# Patient Record
Sex: Female | Born: 1990 | Race: Black or African American | Hispanic: No | Marital: Single | Smoking: Former smoker
Health system: Southern US, Community
[De-identification: ages and names within clinical notes are randomized; demographics above are authoritative.]

## PROBLEM LIST (undated history)

## (undated) DIAGNOSIS — D649 Anemia, unspecified: Secondary | ICD-10-CM

## (undated) DIAGNOSIS — Z789 Other specified health status: Secondary | ICD-10-CM

## (undated) DIAGNOSIS — F32A Depression, unspecified: Secondary | ICD-10-CM

## (undated) DIAGNOSIS — Z3490 Encounter for supervision of normal pregnancy, unspecified, unspecified trimester: Secondary | ICD-10-CM

## (undated) HISTORY — PX: NO PAST SURGERIES: SHX2092

---

## 2015-04-24 ENCOUNTER — Encounter (HOSPITAL_COMMUNITY): Payer: Self-pay | Admitting: Emergency Medicine

## 2015-04-24 ENCOUNTER — Emergency Department (HOSPITAL_COMMUNITY)
Admission: EM | Admit: 2015-04-24 | Discharge: 2015-04-25 | Disposition: A | Discharge status: Other Acute Inpatient Hospital | Payer: Self-pay | Attending: Emergency Medicine | Admitting: Emergency Medicine

## 2015-04-24 DIAGNOSIS — Y9289 Other specified places as the place of occurrence of the external cause: Secondary | ICD-10-CM | POA: Insufficient documentation

## 2015-04-24 DIAGNOSIS — Z3202 Encounter for pregnancy test, result negative: Secondary | ICD-10-CM | POA: Insufficient documentation

## 2015-04-24 DIAGNOSIS — Z7982 Long term (current) use of aspirin: Secondary | ICD-10-CM | POA: Insufficient documentation

## 2015-04-24 DIAGNOSIS — T1491XA Suicide attempt, initial encounter: Secondary | ICD-10-CM

## 2015-04-24 DIAGNOSIS — F1210 Cannabis abuse, uncomplicated: Secondary | ICD-10-CM | POA: Insufficient documentation

## 2015-04-24 DIAGNOSIS — Y9389 Activity, other specified: Secondary | ICD-10-CM | POA: Insufficient documentation

## 2015-04-24 DIAGNOSIS — Y998 Other external cause status: Secondary | ICD-10-CM | POA: Insufficient documentation

## 2015-04-24 DIAGNOSIS — T39012A Poisoning by aspirin, intentional self-harm, initial encounter: Secondary | ICD-10-CM | POA: Insufficient documentation

## 2015-04-24 DIAGNOSIS — F1410 Cocaine abuse, uncomplicated: Secondary | ICD-10-CM | POA: Insufficient documentation

## 2015-04-24 LAB — POC URINE PREG, ED: Preg Test, Ur: NEGATIVE

## 2015-04-24 LAB — COMPREHENSIVE METABOLIC PANEL
ALT: 10 U/L — ABNORMAL LOW (ref 14–54)
ANION GAP: 7 (ref 5–15)
AST: 15 U/L (ref 15–41)
Albumin: 3.8 g/dL (ref 3.5–5.0)
Alkaline Phosphatase: 57 U/L (ref 38–126)
BUN: 9 mg/dL (ref 6–20)
CHLORIDE: 109 mmol/L (ref 101–111)
CO2: 24 mmol/L (ref 22–32)
Calcium: 8.6 mg/dL — ABNORMAL LOW (ref 8.9–10.3)
Creatinine, Ser: 1.06 mg/dL — ABNORMAL HIGH (ref 0.44–1.00)
Glucose, Bld: 100 mg/dL — ABNORMAL HIGH (ref 65–99)
POTASSIUM: 3.3 mmol/L — AB (ref 3.5–5.1)
SODIUM: 140 mmol/L (ref 135–145)
Total Bilirubin: 0.3 mg/dL (ref 0.3–1.2)
Total Protein: 7.2 g/dL (ref 6.5–8.1)

## 2015-04-24 LAB — CBC WITH DIFFERENTIAL/PLATELET
Basophils Absolute: 0 10*3/uL (ref 0.0–0.1)
Basophils Relative: 1 % (ref 0–1)
EOS ABS: 0 10*3/uL (ref 0.0–0.7)
EOS PCT: 1 % (ref 0–5)
HCT: 30.4 % — ABNORMAL LOW (ref 36.0–46.0)
Hemoglobin: 9.4 g/dL — ABNORMAL LOW (ref 12.0–15.0)
LYMPHS ABS: 2.2 10*3/uL (ref 0.7–4.0)
Lymphocytes Relative: 51 % — ABNORMAL HIGH (ref 12–46)
MCH: 25.6 pg — AB (ref 26.0–34.0)
MCHC: 30.9 g/dL (ref 30.0–36.0)
MCV: 82.8 fL (ref 78.0–100.0)
MONO ABS: 0.3 10*3/uL (ref 0.1–1.0)
MONOS PCT: 8 % (ref 3–12)
Neutro Abs: 1.6 10*3/uL — ABNORMAL LOW (ref 1.7–7.7)
Neutrophils Relative %: 39 % — ABNORMAL LOW (ref 43–77)
PLATELETS: 277 10*3/uL (ref 150–400)
RBC: 3.67 MIL/uL — AB (ref 3.87–5.11)
RDW: 17.1 % — AB (ref 11.5–15.5)
WBC: 4.2 10*3/uL (ref 4.0–10.5)

## 2015-04-24 LAB — MAGNESIUM: MAGNESIUM: 1.9 mg/dL (ref 1.7–2.4)

## 2015-04-24 LAB — RAPID URINE DRUG SCREEN, HOSP PERFORMED
Amphetamines: NOT DETECTED
BARBITURATES: NOT DETECTED
BENZODIAZEPINES: NOT DETECTED
COCAINE: POSITIVE — AB
OPIATES: NOT DETECTED
TETRAHYDROCANNABINOL: POSITIVE — AB

## 2015-04-24 LAB — ACETAMINOPHEN LEVEL

## 2015-04-24 LAB — PROTIME-INR
INR: 0.99 (ref 0.00–1.49)
PROTHROMBIN TIME: 13.3 s (ref 11.6–15.2)

## 2015-04-24 LAB — SALICYLATE LEVEL: Salicylate Lvl: 26.3 mg/dL (ref 2.8–30.0)

## 2015-04-24 LAB — ETHANOL: Alcohol, Ethyl (B): <5 mg/dL (ref <5)

## 2015-04-24 NOTE — ED Notes (Signed)
Unable to collect labs at this time patient talking with TTS. 

## 2015-04-24 NOTE — BH Assessment (Addendum)
Tele Assessment Note   Peggy Hooper is an 24 y.o. female.  -Clinician discussed patient with Dr. Clayborne Dana.  He said that patient reports having taken about 21 aspirin last night in an attempt to overdose.  Pt reports nausea and ringing in her ears.  Patient is calm and pleasant during assessment.  When asked what she intended to do when she took the aspirin last night she replies, "I wanted to kill myself."  Pt currently denies wanting to kill self and says she can contract for safety.  Patient has no previous suicide attempts.  She denies any HI or A/V hallucinations.  Patient says that she lives with her boyfriend.  He is what is causing stress in her life.  She says that school [employer]) is stressful.  Patient denies depressive symptoms but does rate herself at a 4/10 on anxiety.  Also reports only getting about 4 hours of sleep per night because of "thinking about things over and over."    Patient has no previous inpatient or outpatient history.  -Clinician did discuss patient care with Donell Sievert, PA who recommends inpatient care.  Dr. Jama Flavors will be the attending.  Patient accepted to [facility].  Dr. Clayborne Dana informed of the patient being accepted to [facility] by Donell Sievert, PA to Dr. Jama Flavors.  Axis I: Major Depression, single episode and Substance Abuse Axis II: Deferred Axis III: History reviewed. No pertinent past medical history. Axis IV: economic problems, other psychosocial or environmental problems and problems with primary support group Axis V: 41-50 serious symptoms  Past Medical History: History reviewed. No pertinent past medical history.  History reviewed. No pertinent past surgical history.  Family History: History reviewed. No pertinent family history.  Social History:  reports that she has never smoked. She does not have any smokeless tobacco history on file. She reports that she uses illicit drugs (Marijuana). She reports that she does not drink alcohol.  Additional Social  History:  Alcohol / Drug Use Pain Medications: None Prescriptions: N/A Over the Counter: N/A History of alcohol / drug use?: Yes Substance #1 Name of Substance 1: Marijuana 1 - Age of First Use: 24 years of age 87 - Amount (size/oz): 1-2 blunt s 1 - Frequency: About 3x/W 1 - Duration: On-going 1 - Last Use / Amount: 08/11  CIWA: CIWA-Ar BP: 131/70 mmHg Pulse Rate: 88 COWS:    PATIENT STRENGTHS: (choose at least two) Ability for insight Average or above average intelligence Communication skills Supportive family/friends  Allergies: No Known Allergies  Home Medications:  (Not in a hospital admission)  OB/GYN Status:  Patient's last menstrual period was 04/19/2015 (approximate).  General Assessment Data Location of Assessment: [facility] TTS Assessment: In system Is this a Tele or Face-to-Face Assessment?: Face-to-Face Is this an Initial Assessment or a Re-assessment for this encounter?: Initial Assessment Marital status: Single Is patient pregnant?: No Pregnancy Status: No Living Arrangements: Spouse/significant other Can pt return to current living arrangement?: Yes Admission Status: Voluntary Is patient capable of signing voluntary admission?: Yes Referral Source: Self/Family/Friend Insurance type: self pay     Crisis Care Plan Living Arrangements: Spouse/significant other Name of Psychiatrist: None Name of Therapist: None  Education Status Is patient currently in school?: Yes Current Grade: 2nd year  Highest grade of school patient has completed: 12th grade Name of school: Enrolled at [employer] person: patient  Risk to self with the past 6 months Suicidal Ideation: No-Not Currently/Within Last 6 Months Has patient been a risk to self within  the past 6 months prior to admission? : Yes Suicidal Intent: No-Not Currently/Within Last 6 Months Has patient had any suicidal intent within the past 6 months prior to admission? : Yes Is patient at risk for  suicide?: Yes Suicidal Plan?: No-Not Currently/Within Last 6 Months Has patient had any suicidal plan within the past 6 months prior to admission? : Yes Access to Means: Yes Specify Access to Suicidal Means: Attempted to OD on ASA last night.  Could get ASA again. What has been your use of drugs/alcohol within the last 12 months?: Marijuana use Previous Attempts/Gestures: No How many times?: 0 Other Self Harm Risks: None Triggers for Past Attempts: None known Intentional Self Injurious Behavior: None Family Suicide History: No Recent stressful life event(s): Conflict (Comment), Financial Problems (Conflict w/ boyfriend; ) Persecutory voices/beliefs?: No Depression: Yes Depression Symptoms: Despondent, Isolating Substance abuse history and/or treatment for substance abuse?: No Suicide prevention information given to non-admitted patients: Not applicable  Risk to Others within the past 6 months Homicidal Ideation: No Does patient have any lifetime risk of violence toward others beyond the six months prior to admission? : No Thoughts of Harm to Others: No Current Homicidal Intent: No Current Homicidal Plan: No Access to Homicidal Means: No Identified Victim: No one History of harm to others?: No Assessment of Violence: In distant past Violent Behavior Description: Pt calm and coperative Does patient have access to weapons?: No Criminal Charges Pending?: No Does patient have a court date: No Is patient on probation?: No  Psychosis Hallucinations: None noted Delusions: None noted  Mental Status Report Appearance/Hygiene: Unremarkable Eye Contact: Good Motor Activity: Freedom of movement, Unremarkable Speech: Logical/coherent, Soft Level of Consciousness: Alert Mood: Pleasant Affect: Anxious Anxiety Level: Moderate Thought Processes: Coherent, Relevant Judgement: Unimpaired Orientation: Person, Place, Time, Situation Obsessive Compulsive Thoughts/Behaviors:  Minimal  Cognitive Functioning Concentration: Decreased Memory: Recent Intact, Remote Intact IQ: Average Insight: Fair Impulse Control: Poor Appetite: Good Weight Loss: 0 Weight Gain: 0 Sleep: Decreased Total Hours of Sleep: 4 Vegetative Symptoms: None  ADLScreening [facility]) Patient's cognitive ability adequate to safely complete daily activities?: Yes Patient able to express need for assistance with ADLs?: Yes Independently performs ADLs?: Yes (appropriate for developmental age)  Prior Inpatient Therapy Prior Inpatient Therapy: No Prior Therapy Dates: None Prior Therapy Facilty/Provider(s): None Reason for Treatment: None  Prior Outpatient Therapy Prior Outpatient Therapy: No Prior Therapy Dates: None Prior Therapy Facilty/Provider(s): N/A Reason for Treatment: N/a Does patient have an ACCT team?: No Does patient have Intensive In-House Services?  : No Does patient have [facility] services? : No Does patient have P4CC services?: No  ADL Screening (condition at time of admission) Patient's cognitive ability adequate to safely complete daily activities?: Yes Is the patient deaf or have difficulty hearing?: No (Reports ringing in her ears bilaterally.) Does the patient have difficulty seeing, even when wearing glasses/contacts?: No Does the patient have difficulty concentrating, remembering, or making decisions?: No Patient able to express need for assistance with ADLs?: Yes Does the patient have difficulty dressing or bathing?: No Independently performs ADLs?: Yes (appropriate for developmental age) Does the patient have difficulty walking or climbing stairs?: No Weakness of Legs: None Weakness of Arms/Hands: None       Abuse/Neglect Assessment (Assessment to be complete while patient is alone) Physical Abuse: Denies Verbal Abuse: Denies Sexual Abuse: Denies Exploitation of patient/patient's resources: Denies     Advance Directives (For  Healthcare) Does patient have an advance directive?: No Would patient like information  on creating an advanced directive?: No - patient declined information    Additional Information 1:1 In Past 12 Months?: No CIRT Risk: No Elopement Risk: No Does patient have medical clearance?: Yes     Disposition:  Disposition Initial Assessment Completed for this Encounter: Yes Disposition of Patient: Other dispositions Other disposition(s): Other (Comment) (To be reviewed with PA)  Beatriz Stallion Ray 04/24/2015 8:46 PM

## 2015-04-24 NOTE — ED Notes (Signed)
Rec'd call from poison control regarding patient.  Pt called them stating she took 25 ASA at 9pm last night.  Pt has started having ringing in ear and abdominal pain.  Per Poison, pt will need, Mg, Gluc, Calcium, electrolytes, tylenol/ASA level, INR, urine preg, EKG  If ASA is greater than 40, pt needs ABG.

## 2015-04-24 NOTE — ED Provider Notes (Addendum)
CSN: [id number]     Arrival date & time 04/24/15  1905 History   First MD Initiated Contact with Patient 04/24/15 1942     Chief Complaint  Patient presents with  . Aspirin OD   . Suicidal     (Consider location/radiation/quality/duration/timing/severity/associated sxs/prior Treatment) Patient is a 24 y.o. female presenting with abdominal pain.  Abdominal Pain Pain location:  Epigastric Pain quality: aching and fullness   Pain radiates to:  Does not radiate Pain severity:  Mild Onset quality:  Gradual Duration:  1 day Timing:  Constant Progression:  Improving Associated symptoms: nausea   Associated symptoms: no chills, no cough, no fever, no hematuria, no shortness of breath and no vomiting     History reviewed. No pertinent past medical history. History reviewed. No pertinent past surgical history. History reviewed. No pertinent family history. Social History  Substance Use Topics  . Smoking status: Never Smoker   . Smokeless tobacco: None  . Alcohol Use: No   OB History    No data available     Review of Systems  Constitutional: Negative for fever and chills.  Eyes: Negative for pain.  Respiratory: Negative for cough and shortness of breath.   Gastrointestinal: Positive for nausea and abdominal pain. Negative for vomiting.  Genitourinary: Negative for hematuria and dyspareunia.  Neurological:       Tinnitus  All other systems reviewed and are negative.     Allergies  Review of patient's allergies indicates no known allergies.  Home Medications   Prior to Admission medications   Medication Sig Start Date End Date Taking? Authorizing Provider  aspirin 325 MG tablet Take 8,125 mg by mouth once.   Yes Historical Provider, MD   BP 122/75 mmHg  Pulse 75  Temp(Src) 98.4 F (36.9 C) (Oral)  Resp 16  Ht 5\' 7"  (1.702 m)  Wt 165 lb (74.844 kg)  BMI 25.84 kg/m2  SpO2 100%  LMP 04/19/2015 (Approximate) Physical Exam  Constitutional: She is oriented to  person, place, and time. She appears well-developed and well-nourished.  HENT:  Head: Normocephalic and atraumatic.  Eyes: Conjunctivae and EOM are normal. Right eye exhibits no discharge. Left eye exhibits no discharge.  Cardiovascular: Normal rate and regular rhythm.   Pulmonary/Chest: Effort normal and breath sounds normal. No respiratory distress.  Abdominal: Soft. She exhibits no distension. There is no tenderness. There is no rebound.  Musculoskeletal: Normal range of motion. She exhibits no edema or tenderness.  Neurological: She is alert and oriented to person, place, and time.  No altered mental status, able to give full seemingly accurate history.  Face is symmetric, EOM's intact, pupils equal and reactive, vision intact, tongue and uvula midline without deviation Upper and Lower extremity motor 5/5, intact pain perception in distal extremities, 2+ reflexes in biceps, patella and achilles tendons. Finger to nose normal, heel to shin normal. Walks without assistance or evident ataxia.   Skin: Skin is warm and dry.  Psychiatric: Her speech is not slurred. She is not hyperactive. She expresses no homicidal and no suicidal ideation. She expresses no suicidal plans and no homicidal plans. She is communicative.  Nursing note and vitals reviewed.   ED Course  Procedures (including critical care time) Labs Review Labs Reviewed  CBC WITH DIFFERENTIAL/PLATELET - Abnormal; Notable for the following:    RBC 3.67 (*)    Hemoglobin 9.4 (*)    HCT 30.4 (*)    MCH 25.6 (*)    RDW 17.1 (*)  Neutrophils Relative % 39 (*)    Neutro Abs 1.6 (*)    Lymphocytes Relative 51 (*)    All other components within normal limits  COMPREHENSIVE METABOLIC PANEL - Abnormal; Notable for the following:    Potassium 3.3 (*)    Glucose, Bld 100 (*)    Creatinine, Ser 1.06 (*)    Calcium 8.6 (*)    ALT 10 (*)    All other components within normal limits  URINE RAPID DRUG SCREEN, HOSP PERFORMED -  Abnormal; Notable for the following:    Cocaine POSITIVE (*)    Tetrahydrocannabinol POSITIVE (*)    All other components within normal limits  ACETAMINOPHEN LEVEL - Abnormal; Notable for the following:    Acetaminophen (Tylenol), Serum <10 (*)    All other components within normal limits  ETHANOL  MAGNESIUM  SALICYLATE LEVEL  PROTIME-INR  SALICYLATE LEVEL  CALCIUM, IONIZED  POC URINE PREG, ED    Imaging Review No results found. I, Aarian Griffie, Barbara Cower, personally reviewed and evaluated these images and lab results as part of my medical decision-making.   EKG Interpretation   Date/Time:  Monday April 24 2015 21:02:11 EDT Ventricular Rate:  80 PR Interval:  187 QRS Duration: 86 QT Interval:  358 QTC Calculation: 413 R Axis:   62 Text Interpretation:  Sinus rhythm RSR' in V1 or V2, probably normal  variant Baseline wander in lead(s) III aVF Confirmed by [facility] MD, Barbara Cower  [phone]) on 04/24/2015 11:14:34 PM      MDM   Final diagnoses:  Suicide attempt   Suicidal attempt last night with 25 325 mg aspirin, no longer suicidal. Has ringing, nausea abdomen discomfort. Will clear medically. TTS for evaluation.   Labs checked no evidence of acidosis, elevated aspirin required intervention. Patient is medically cleared, TTS is evaluated and feels that the patient needs inpatient care. Patient initially hesitant to come in voluntary however realize that would be the best route for her and will come in voluntary. We'll likely go to [facility] tomorrow for assessment and management.  2340: Spoke with Poison Control and recommending repeat salicylate and if not going up then will be cleared.   0139: Repeat salicylate decreased, is stable for transfer.    Marily Memos, MD 04/24/15 2315  Marily Memos, MD 04/24/15 6045  Marily Memos, MD 04/25/15 [phone]

## 2015-04-24 NOTE — ED Notes (Signed)
Pt reports taking 21 Aspirin last night between 2100 and 2200. Reports suicidal ideations. Denies homicidal ideations. Endorses nausea and ringing in ears. No active vomiting noted. Reports increase stress recently. Calm and cooperative during triage. No other c/c.

## 2015-04-25 ENCOUNTER — Encounter (HOSPITAL_COMMUNITY): Payer: Self-pay | Admitting: *Deleted

## 2015-04-25 ENCOUNTER — Inpatient Hospital Stay (HOSPITAL_COMMUNITY)
Admission: EM | Admit: 2015-04-25 | Discharge: 2015-04-27 | DRG: 885 | Disposition: A | Discharge status: Home / Self Care | Payer: Federal, State, Local not specified - Other | Source: Intra-hospital | Attending: Psychiatry | Admitting: Psychiatry

## 2015-04-25 DIAGNOSIS — E876 Hypokalemia: Secondary | ICD-10-CM | POA: Diagnosis present

## 2015-04-25 DIAGNOSIS — F332 Major depressive disorder, recurrent severe without psychotic features: Principal | ICD-10-CM | POA: Diagnosis present

## 2015-04-25 DIAGNOSIS — T1491 Suicide attempt: Secondary | ICD-10-CM | POA: Diagnosis not present

## 2015-04-25 DIAGNOSIS — Z833 Family history of diabetes mellitus: Secondary | ICD-10-CM

## 2015-04-25 DIAGNOSIS — T50902A Poisoning by unspecified drugs, medicaments and biological substances, intentional self-harm, initial encounter: Secondary | ICD-10-CM

## 2015-04-25 DIAGNOSIS — T39012A Poisoning by aspirin, intentional self-harm, initial encounter: Secondary | ICD-10-CM | POA: Diagnosis not present

## 2015-04-25 DIAGNOSIS — D649 Anemia, unspecified: Secondary | ICD-10-CM | POA: Diagnosis present

## 2015-04-25 DIAGNOSIS — D6489 Other specified anemias: Secondary | ICD-10-CM

## 2015-04-25 HISTORY — DX: Other specified health status: Z78.9

## 2015-04-25 LAB — SALICYLATE LEVEL: Salicylate Lvl: 22.5 mg/dL (ref 2.8–30.0)

## 2015-04-25 LAB — RETICULOCYTES
RBC.: 3.53 MIL/uL — ABNORMAL LOW (ref 3.87–5.11)
RETIC CT PCT: 1.5 % (ref 0.4–3.1)
Retic Count, Absolute: 53 10*3/uL (ref 19.0–186.0)

## 2015-04-25 LAB — FOLATE: FOLATE: 6.4 ng/mL (ref >5.9)

## 2015-04-25 MED ORDER — ALUM & MAG HYDROXIDE-SIMETH 200-200-20 MG/5ML PO SUSP: 30.0000 mL | ORAL | Status: DC | PRN

## 2015-04-25 MED ORDER — MAGNESIUM HYDROXIDE 400 MG/5ML PO SUSP: 30.0000 mL | Freq: Every day | ORAL | Status: DC | PRN

## 2015-04-25 MED ORDER — POTASSIUM CHLORIDE CRYS ER 10 MEQ PO TBCR
10.0000 meq | EXTENDED_RELEASE_TABLET | Freq: Three times a day (TID) | ORAL | Status: AC
  Administered 2015-04-25 – 2015-04-26 (×4): 10 meq via ORAL
  Filled 2015-04-25 (×5): qty 1

## 2015-04-25 MED ORDER — HYDROXYZINE HCL 25 MG PO TABS
25.0000 mg | ORAL_TABLET | Freq: Four times a day (QID) | ORAL | Status: DC | PRN
  Filled 2015-04-25: qty 20

## 2015-04-25 MED ORDER — TRAZODONE HCL 50 MG PO TABS
50.0000 mg | ORAL_TABLET | Freq: Every evening | ORAL | Status: DC | PRN
  Filled 2015-04-25: qty 28
  Filled 2015-04-25 (×3): qty 1
  Filled 2015-04-25: qty 28
  Filled 2015-04-25 (×3): qty 1

## 2015-04-25 MED ORDER — ACETAMINOPHEN 325 MG PO TABS: 650.0000 mg | ORAL_TABLET | Freq: Four times a day (QID) | ORAL | Status: DC | PRN
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
## 2015-04-25 NOTE — Plan of Care (Signed)
Problem: Consults Goal: Depression Patient Education See Patient Education Module for education specifics.  Outcome: Progressing Nurse discussed depression/coping skills with patient.        

## 2015-04-25 NOTE — Progress Notes (Signed)
D:  Patient's self inventory sheet, patient slept good last night, no sleep medication given.  Good appetite, normal energy level, good concentration.  Denied depression, hopeless and anxiety.  Denied withdrawals.  Denied SI.  Denied physical problems.  Denied physical pain.  Goal is to stay positive and understand she is only one person, cannot help everyone.  Plans to continuously tell herself positive things.  Does have discharge plans.  No problems anticipated after discharge. A:  No medications administered this morning.  Emotional support and encouragement given patient. R:  Denied SI and HI, contracts for safety.  Denied A/V hallucinations.  Safety maintained with 15 minute checks.

## 2015-04-25 NOTE — Tx Team (Signed)
Interdisciplinary Treatment Plan Update (Adult) Date: 04/25/2015   Date: 04/25/2015 10:20 AM  Progress in Treatment:  Attending groups: Peggy Hooper is new to milieu, continuing to assess  Participating in groups: Peggy Hooper is new to milieu, continuing to assess  Taking medication as prescribed: Yes  Tolerating medication: Yes  Family/Significant othe contact made: No, CSW assessing for appropriate contact Patient understands diagnosis: Continuing to assess Discussing patient identified problems/goals with staff: Yes  Medical problems stabilized or resolved: Yes  Denies suicidal/homicidal ideation: Yes Patient has not harmed self or Others: Yes   New problem(s) identified: None identified at this time.   Discharge Plan or Barriers: CSW will assess for appropriate discharge plan and relevant barriers.   Additional comments: n/a   Reason for Continuation of Hospitalization:  Depression Medication stabilization Suicidal ideation  Estimated length of stay: 3-5 days  Review of initial/current patient goals per problem list:   1.  Goal(s): Patient will participate in aftercare plan  Met:  No  Target date: 3-5 days from date of admission   As evidenced by: Patient will participate within aftercare plan AEB aftercare provider and housing plan at discharge being identified.  04/25/15: CSW to work with Peggy Hooper to assess for appropriate discharge plan and faciliate appointments and referrals as needed prior to d/c.  2.  Goal (s): Patient will exhibit decreased depressive symptoms and suicidal ideations.  Met:  No  Target date:3-5 days from date of admission   As evidenced by: Patient will utilize self rating of depression at 3 or below and demonstrate decreased signs of depression or be deemed stable for discharge by MD. Peggy Hooper was admitted with symptoms of depression, rating 10/10. Peggy Hooper continues to present with flat affect and depressive symptoms.  Peggy Hooper will demonstrate decreased symptoms of depression and  rate depression at 3/10 or lower prior to discharge.   Attendees:  Patient:    Family:    Physician: Dr. Parke Poisson, MD  04/25/2015 10:20 AM  Nursing: Lars Pinks, RN Case manager  04/25/2015 10:20 AM  Clinical Social Worker Norman Clay, MSW 04/25/2015 10:20 AM  Other: Lucinda Dell, Beverly Sessions Liasion 04/25/2015 10:20 AM  Clinical:  Grayland Ormond, RN; Darrol Angel, RN 04/25/2015 10:20 AM  Other: , RN Charge Nurse 04/25/2015 10:20 AM  Other:     Peri Maris, Latanya Presser MSW

## 2015-04-25 NOTE — Progress Notes (Signed)
24 year old female pt admitted on voluntary basis. Pt, on admission, reports work and school stress as a reason for the overdose. Pt denies any abdominal pain or ringing in her ears on admission. 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 Pt reports that when she gets discharged she plans on living with her parents. Pt denies any SI on admission and is able to contract for safety on the unit. Pt was oriented to the unit and safety maintained.

## 2015-04-25 NOTE — Progress Notes (Signed)
Recreation Therapy Notes  Animal-Assisted Activity (AAA) Program Checklist/Progress Notes Patient Eligibility Criteria Checklist & Daily Group note for Rec Tx Intervention  Date: 08.16.2016 Time: 2:45pm Location: [address]    AAA/T Program Assumption of Risk Form signed by Patient/ or Parent Legal Guardian yes  Patient is free of allergies or sever asthma yes  Patient reports no fear of animals yes  Patient reports no history of cruelty to animals yes  Patient understands his/her participation is voluntary yes  Patient washes hands before animal contact yes  Patient washes hands after animal contact yes  Behavioral Response: Engaged, Appropriate  Education: Hand Washing, Appropriate Animal Interaction   Education Outcome: Acknowledges understanding  Clinical Observations/Feedback: Patient engaged appropriately in session, petting therapy dog appropriately and sharing stories about her pet at home.   Marykay Lex Kyliana Standen, LRT/CTRS  Jearl Klinefelter 04/25/2015 2:56 PM

## 2015-04-25 NOTE — [facility] Suicide Risk Assessment (Signed)
[facility] Admission Suicide Risk Assessment   Nursing information obtained from:    Demographic factors:   24 year old single female, no children, in college , employed  Current Mental Status:   see below Loss Factors:   dealing with family stressors  Historical Factors:   denies prior psychiatric history Risk Reduction Factors:   resilience, employment, good coping skills.  Total Time spent with patient: 45 minutes Principal Problem: Suicide attempt by overdose  Diagnosis:   Patient Active Problem List   Diagnosis Date Noted  . MDD (major depressive disorder), recurrent episode, severe [F33.2] 04/25/2015     Continued Clinical Symptoms:  Alcohol Use Disorder Identification Test Final Score (AUDIT): 0 The "Alcohol Use Disorders Identification Test", Guidelines for Use in Primary Care, Second Edition.  World Science writer [facility]). Score between 0-7:  no or low risk or alcohol related problems. Score between 8-15:  moderate risk of alcohol related problems. Score between 16-19:  high risk of alcohol related problems. Score 20 or above:  warrants further diagnostic evaluation for alcohol dependence and treatment.   CLINICAL FACTORS:  24 year old female, employed, enrolled in college, who denies prior psychiatric history or depression. She impulsively overdosed on ASA, which she chose because there was a bottle of it nearby, after a stressful conversation with sister. Denies any prior history of suicidal attempts or thoughts, and states this was very out of character for her. Denies prior depression. Smokes cannabis several times a  Week. UDS also positive for cocaine , which patient is not aware of having taken.   Musculoskeletal: Strength & Muscle Tone: within normal limits Gait & Station: normal Patient leans: NA  Psychiatric Specialty Exam: Physical Exam  ROS  Blood pressure 116/66, pulse 81, temperature 98 F (36.7 C), temperature source Oral, resp. rate 16, height  (1.702  m), weight 167 lb (75.751 kg), last menstrual period 04/19/2015.Body mass index is 26.15 kg/(m^2).  See Admit Note MSE                                                        COGNITIVE FEATURES THAT CONTRIBUTE TO RISK:  Closed-mindedness and Loss of executive function    SUICIDE RISK:   Moderate:  Frequent suicidal ideation with limited intensity, and duration, some specificity in terms of plans, no associated intent, good self-control, limited dysphoria/symptomatology, some risk factors present, and identifiable protective factors, including available and accessible social support.  PLAN OF CARE: Patient will be admitted to inpatient psychiatric unit for stabilization and safety. Will provide and encourage milieu participation. Provide medication management and maked adjustments as needed.  Will follow daily.    Medical Decision Making:  Established Problem, Stable/Improving (1), Review of Psycho-Social Stressors (1), Review or order clinical lab tests (1) and Review of Medication Regimen & Side Effects (2)  I certify that inpatient services furnished can reasonably be expected to improve the patient's condition.   COBOS, FERNANDO 04/25/2015, 11:20 AM

## 2015-04-25 NOTE — Consult Note (Addendum)
[facility] Medical Consultation  Peggy Hooper BJY:[id number] DOB: December 04, 1990 DOA: 04/25/2015 PCP: No primary care provider on file.   Requesting physician: Dr Peggy Hooper Date of consultation: 04/25/15 Reason for consultation: anemia  Impression/Recommendations Principal Problem:   Suicide attempt by drug ingestion/  MDD (major depressive disorder), recurrent episode, severe - per psychiatry  Active Problems: Anemia, normocytic - obtain anemia panel, repeat CBC tomorrow AM - we will follow up tomorrow when results are available - check stool occults - advised to find a PCP to further follow up on this issue  Hypokalemia - being replaced by psych- recheck tomorrow  [facility] will followup again tomorrow. Please contact me if I can be of assistance in the meanwhile. Thank you for this consultation.  Chief Complaint: consulted for anemia  HPI:  24 y/o female with no past medical or surgical history admitted to behavioral medicine with an aspirin overdose. Work up reveals a Hb of 9.3  She has never had her Hb checked in the past as far as she knows. She has tried to give blood 3 times and has been told she has low iron levels but has never gone to a doctor to have this addressed. She has no c/o increased sleepiness, lethargy, dyspnea on exertion or chest pain on exertion. She has occasional heavy menses but not prolonged menses. No blood noted in stool, no h/o PUD, epigastric pain or weight loss. Does state she bruises easily.    Review of Systems  Constitutional: Negative for fever, chills weight loss HENT: Negative for ear pain, nosebleeds, congestion, facial swelling, rhinorrhea, neck pain, neck stiffness and ear discharge.   Respiratory: Negative for cough, shortness of breath, wheezing  Cardiovascular: Negative for chest pain, palpitations and leg swelling.  Gastrointestinal: Negative for heartburn, abdominal pain, vomiting, diarrhea or consitpation Genitourinary:  Negative for dysuria, urgency, frequency, hematuria Musculoskeletal: Negative for back pain or joint pain Neurological: Negative for dizziness, seizures, syncope, focal weakness,  numbness and headaches.  Hematological: Does not bruise/bleed easily.  Psychiatric/Behavioral: Negative for hallucinations, confusion, dysphoric mood   Past Medical History  Diagnosis Date  . Medical history non-contributory    Past Surgical History  Procedure Laterality Date  . No past surgeries     Social History:  reports that she has never smoked. She does not have any smokeless tobacco history on file. She reports that she uses illicit drugs (Marijuana). She reports that she does not drink alcohol.  No Known Allergies Family history: mother has diabetes   Prior to Admission medications   Medication Sig Start Date End Date Taking? Authorizing Provider  aspirin 325 MG tablet Take 8,125 mg by mouth once.    Historical Provider, MD    Physical Exam: Blood pressure 138/68, pulse 69, temperature 98 F (36.7 C), temperature source Oral, resp. rate 16, height 5\' 7"  (1.702 m), weight 75.751 kg (167 lb), last menstrual period 04/19/2015.  Filed Weights   04/25/15 0251  Weight: 75.751 kg (167 lb)   No intake or output data in the 24 hours ending 04/25/15 1740   Constitutional: Appears well-developed and well-nourished. No distress. HENT: Normocephalic. External right and left ear normal. Oropharynx is clear and moist.  Eyes: Conjunctivae pale-  EOM are normal. PERRLA, no scleral icterus.  Neck: Normal ROM. Neck supple. No JVD. No tracheal deviation. No thyromegaly.  CVS: RRR, S1/S2 +, no murmurs, no gallops, no carotid bruit.  Pulmonary: Effort and breath sounds normal, no stridor, rhonchi, wheezes, rales.  Abdominal: Soft. BS +,  no distension, tenderness, rebound or guarding.  Musculoskeletal: Normal range of motion. No edema and no tenderness.  Neuro: Alert. Normal reflexes, muscle tone  coordination. No cranial nerve deficit. Skin: Skin is warm and dry. No rash noted. Not diaphoretic. No erythema. No pallor. No bruising Psychiatric: Normal mood and affect. Behavior, judgment, thought content normal.    Labs on Admission:  Basic Metabolic Panel:  Recent Labs Lab 04/24/15 2106  NA 140  K 3.3*  CL 109  CO2 24  GLUCOSE 100*  BUN 9  CREATININE 1.06*  CALCIUM 8.6*  MG 1.9   Liver Function Tests:  Recent Labs Lab 04/24/15 2106  AST 15  ALT 10*  ALKPHOS 57  BILITOT 0.3  PROT 7.2  ALBUMIN 3.8   No results for input(s): LIPASE, AMYLASE in the last 168 hours. No results for input(s): AMMONIA in the last 168 hours. CBC:  Recent Labs Lab 04/24/15 2106  WBC 4.2  NEUTROABS 1.6*  HGB 9.4*  HCT 30.4*  MCV 82.8  PLT 277   Cardiac Enzymes: No results for input(s): CKTOTAL, CKMB, CKMBINDEX, TROPONINI in the last 168 hours. BNP: Invalid input(s): POCBNP CBG: No results for input(s): GLUCAP in the last 168 hours.  Radiological Exams on Admission: No results found.   Time spent: 50 min  Peggy Grandpre, MD [facility] To page rounding or on call physician  [email or web address] 04/25/2015, 5:40 PM

## 2015-04-25 NOTE — [facility] Group Notes (Signed)
The focus of this group is to educate the patient on the purpose and policies of crisis stabilization and provide a format to answer questions about their admission.  The group details unit policies and expectations of patients while admitted.  Patient attended 0900 nurse education orientation group this morning.  Patient actively participated and had appropriate affect.  Patient was alert.  Patient had appropriate insight and engagement.  Today patient will work on 3 goals for discharge.   

## 2015-04-25 NOTE — [facility] Suicide Risk Assessment (Signed)
[facility] INPATIENT:  Family/Significant Other Suicide Prevention Education  Suicide Prevention Education:  Education Completed; Teressa Senter, Pt's friend [phone],  has been identified by the patient as the family member/significant other with whom the patient will be residing, and identified as the person(s) who will aid the patient in the event of a mental health crisis (suicidal ideations/suicide attempt).  With written consent from the patient, the family member/significant other has been provided the following suicide prevention education, prior to the and/or following the discharge of the patient.  The suicide prevention education provided includes the following:  Suicide risk factors  Suicide prevention and interventions  National Suicide Hotline telephone number  [facility] assessment telephone number  [facility] Emergency Assistance 911  [facility] and/or Residential Mobile Crisis Unit telephone number  Request made of family/significant other to:  Remove weapons (e.g., guns, rifles, knives), all items previously/currently identified as safety concern.    Remove drugs/medications (over-the-counter, prescriptions, illicit drugs), all items previously/currently identified as a safety concern.  The family member/significant other verbalizes understanding of the suicide prevention education information provided.  The family member/significant other agrees to remove the items of safety concern listed above.  Elaina Hoops 04/25/2015, 3:26 PM

## 2015-04-25 NOTE — [facility] Group Notes (Signed)
[facility] LCSW Group Therapy 04/25/2015 1:15 PM  Type of Therapy: Group Therapy- Feelings about Diagnosis  Participation Level: Reserved  Participation Quality:  Reserved  Affect:  Appropriate  Cognitive: Alert and Oriented   Insight:  Developing   Engagement in Therapy: Developing/Improving and Engaged   Modes of Intervention: Clarification, Confrontation, Discussion, Education, Exploration, Limit-setting, Orientation, Problem-solving, Rapport Building, Dance movement psychotherapist, Socialization and Support  Description of Group:   This group will allow patients to explore their thoughts and feelings about diagnoses they have received. Patients will be guided to explore their level of understanding and acceptance of these diagnoses. Facilitator will encourage patients to process their thoughts and feelings about the reactions of others to their diagnosis, and will guide patients in identifying ways to discuss their diagnosis with significant others in their lives. This group will be process-oriented, with patients participating in exploration of their own experiences as well as giving and receiving support and challenge from other group members.  Summary of Progress/Problems:  Pt was reserved during group discussion but participated when prompted. Pt identified that her selflessness is a positive aspect of her personality. She described how she often feels overwhelmed because she is busy.  Therapeutic Modalities:   Cognitive Behavioral Therapy Solution Focused Therapy Motivational Interviewing Relapse Prevention Therapy  Chad Cordial, LCSWA 04/25/2015 2:55 PM

## 2015-04-25 NOTE — Tx Team (Signed)
Initial Interdisciplinary Treatment Plan   PATIENT STRESSORS: Financial difficulties Marital or family conflict Occupational concerns   PATIENT STRENGTHS: Ability for insight Active sense of humor Average or above average intelligence Capable of independent living Communication skills General fund of knowledge Motivation for treatment/growth Supportive family/friends   PROBLEM LIST: Problem List/Patient Goals Date to be addressed Date deferred Reason deferred Estimated date of resolution  "I get angry a lot" 04/25/15     Depression 04/25/15     Suicidal Ideation 04/25/15                                          DISCHARGE CRITERIA:  Ability to meet basic life and health needs Improved stabilization in mood, thinking, and/or behavior Verbal commitment to aftercare and medication compliance  PRELIMINARY DISCHARGE PLAN: Attend aftercare/continuing care group  PATIENT/FAMIILY INVOLVEMENT: This treatment plan has been presented to and reviewed with the patient, Peggy Hooper, and/or family member, .  The patient and family have been given the opportunity to ask questions and make suggestions.  Peggy Hooper, Peggy Hooper 04/25/2015, 2:59 AM

## 2015-04-26 DIAGNOSIS — T50902A Poisoning by unspecified drugs, medicaments and biological substances, intentional self-harm, initial encounter: Secondary | ICD-10-CM

## 2015-04-26 DIAGNOSIS — F332 Major depressive disorder, recurrent severe without psychotic features: Secondary | ICD-10-CM | POA: Diagnosis not present

## 2015-04-26 DIAGNOSIS — D649 Anemia, unspecified: Secondary | ICD-10-CM

## 2015-04-26 LAB — FERRITIN: Ferritin: 2 ng/mL — ABNORMAL LOW (ref 11–307)

## 2015-04-26 LAB — CALCIUM, IONIZED: CALCIUM, IONIZED, SERUM: 4.9 mg/dL (ref 4.5–5.6)

## 2015-04-26 LAB — CBC
HEMATOCRIT: 30.8 % — AB (ref 36.0–46.0)
HEMOGLOBIN: 9.6 g/dL — AB (ref 12.0–15.0)
MCH: 26.2 pg (ref 26.0–34.0)
MCHC: 31.2 g/dL (ref 30.0–36.0)
MCV: 83.9 fL (ref 78.0–100.0)
Platelets: 293 10*3/uL (ref 150–400)
RBC: 3.67 MIL/uL — AB (ref 3.87–5.11)
RDW: 17.2 % — AB (ref 11.5–15.5)
WBC: 4 10*3/uL (ref 4.0–10.5)

## 2015-04-26 LAB — BASIC METABOLIC PANEL
ANION GAP: 4 — AB (ref 5–15)
BUN: 9 mg/dL (ref 6–20)
CHLORIDE: 107 mmol/L (ref 101–111)
CO2: 27 mmol/L (ref 22–32)
Calcium: 8.8 mg/dL — ABNORMAL LOW (ref 8.9–10.3)
Creatinine, Ser: 0.9 mg/dL (ref 0.44–1.00)
GFR calc Af Amer: >60 mL/min (ref >60)
GFR calc non Af Amer: >60 mL/min (ref >60)
GLUCOSE: 91 mg/dL (ref 65–99)
POTASSIUM: 3.3 mmol/L — AB (ref 3.5–5.1)
Sodium: 138 mmol/L (ref 135–145)

## 2015-04-26 LAB — IRON AND TIBC
IRON: 26 ug/dL — AB (ref 28–170)
Saturation Ratios: 6 % — ABNORMAL LOW (ref 10.4–31.8)
TIBC: 472 ug/dL — AB (ref 250–450)
UIBC: 446 ug/dL

## 2015-04-26 LAB — OCCULT BLOOD X 1 CARD TO LAB, STOOL: Fecal Occult Bld: NEGATIVE

## 2015-04-26 LAB — VITAMIN B12: Vitamin B-12: 240 pg/mL (ref 180–914)

## 2015-04-26 MED ORDER — POTASSIUM CHLORIDE CRYS ER 20 MEQ PO TBCR
40.0000 meq | EXTENDED_RELEASE_TABLET | Freq: Once | ORAL | Status: AC
  Administered 2015-04-26: 40 meq via ORAL
  Filled 2015-04-26 (×2): qty 2

## 2015-04-26 MED ORDER — FERROUS SULFATE 325 (65 FE) MG PO TABS
325.0000 mg | ORAL_TABLET | Freq: Two times a day (BID) | ORAL | Status: DC
  Administered 2015-04-27: 325 mg via ORAL
  Filled 2015-04-26 (×5): qty 1

## 2015-04-26 NOTE — Progress Notes (Signed)
Patient given hat for toilet along with instructions to notify staff as soon as she has a bowel movement. Lawrence Marseilles

## 2015-04-26 NOTE — Progress Notes (Signed)
Recreation Therapy Notes  Date: 08.17.2016 Time: 9:30am Location: [address] Group room   Group Topic: Stress Management  Goal Area(s) Addresses:  Patient will actively participate in stress management techniques presented during session.   Behavioral Response: Engaged, Appropriate   Intervention: Stress management techniques  Activity :  Deep Breathing and Progressive Body Muscle Relaxation. LRT provided instruction and demonstration on practice of Progressive Muscle Relaxation. Technique was coupled with deep breathing.   Education:  Stress Management, Discharge Planning.   Education Outcome: Acknowledges education  Clinical Observations/Feedback: Patient actively participated in stress management technique taught during session. Patient expressed no difficulties and that she could complete technique independently post d/c.    Marykay Lex [address], LRT/CTRS  Luis Sami L 04/26/2015 2:57 PM

## 2015-04-26 NOTE — Progress Notes (Signed)
D. Pt had been up and visible in milieu this evening, did attend and participate in evening group activity. Pt seen interacting appropriately with staff and peers in milieu. Pt reports that she has been doing good today, affect and mood are congruent, pt reported that she is sleeping well and reports that she has no need for any sleep medications. Pt attended to personal hygiene before retiring to her room for the evening. A. Support and encouragement provided. R. Safety maintained, will continue to monitor.

## 2015-04-26 NOTE — Progress Notes (Signed)
hgb stable, labs showed iron deficiency with normal b12/folate level, FOBT pending collection, start iron supplement, need pmd follow up.

## 2015-04-26 NOTE — Progress Notes (Deleted)
D: Peggy Hooper is very euphoric/cheerful during our discussion. She is smiling and laughing while talking with her roommate. She states today was "full of excitement". She was noted in the day room to be hypervigilant and very talkative with most of the patients. She had visitors today and states the visit was great. Her goal for today was to remain positive and she feels like she met her goal for today. She denies and thoughts of harming herself and contracts for safety at this time. She has no complaints or concerns at this time.  A: Instructed Lexy to work on her goal for tomorrow and we will discuss her goal prior to bedtime.  R: Will continue to monitor for patient safety and any mood disturbances.

## 2015-04-26 NOTE — [facility] Group Notes (Signed)
[facility] LCSW Aftercare Discharge Planning Group Note  04/26/2015 8:45 AM  Participation Quality: Alert, Appropriate and Oriented  Mood/Affect: Appropriate  Depression Rating: 0  Anxiety Rating: 0  Thoughts of Suicide: Pt denies SI/HI  Will you contract for safety? Yes  Current AVH: Pt denies  Plan for Discharge/Comments: Pt attended discharge planning group and actively participated in group. CSW discussed suicide prevention education with the group and encouraged them to discuss discharge planning and any relevant barriers. Pt reports that she feels great and is ready to leave. Pt affect is bright and she is interacting well with peers.  Transportation Means: Pt reports access to transportation  Supports: No supports mentioned at this time  Chad Cordial, LCSWA 04/26/2015 9:14 AM

## 2015-04-26 NOTE — [facility] Group Notes (Signed)
[facility] LCSW Group Therapy 04/26/2015 1:15 PM  Type of Therapy: Group Therapy- Emotion Regulation  Participation Level: Active   Participation Quality:  Appropriate  Affect: Appropriate  Cognitive: Alert and Oriented   Insight:  Developing/Improving  Engagement in Therapy: Developing/Improving and Engaged   Modes of Intervention: Clarification, Confrontation, Discussion, Education, Exploration, Limit-setting, Orientation, Problem-solving, Rapport Building, Dance movement psychotherapist, Socialization and Support  Summary of Progress/Problems: The topic for group today was emotional regulation. This group focused on both positive and negative emotion identification and allowed group members to process ways to identify feelings, regulate negative emotions, and find healthy ways to manage internal/external emotions. Group members were asked to reflect on a time when their reaction to an emotion led to a negative outcome and explored how alternative responses using emotion regulation would have benefited them. Group members were also asked to discuss a time when emotion regulation was utilized when a negative emotion was experienced. Pt was active in group discussion and identified sadness as an emotion she has difficulty regulating. Pt interacted well with peers and offered appropriate suggestions for strategies to help regulate emotions. Pt identified "taking a break" when feeling angry in order to evaluate the situation.  She was encouraging to other peers and was receptive to feedback.   Chad Cordial, LCSWA 04/26/2015 2:19 PM

## 2015-04-26 NOTE — Progress Notes (Signed)
D: Peggy Hooper states her goal for Thursday is "to be positive, stay out of trouble and go home tomorrow". A: Encouraged Peggy Hooper to continue to read over all the positive statements she has written in her journal even after she is discharged.    R: Will continue to monitor for patient safety.

## 2015-04-26 NOTE — Progress Notes (Signed)
Patient has been animated, bright in affect today. Mood pleasant. Minimizing events prior to admit. Requesting discharge. Rates depression, hopelessness and anxiety all at a 0/10. No pain or physical complaints. States goal is to "stay positive and think more positive." Medicated per orders, support and encouragement offered. Patient denies SI/HI and is safe. Lawrence Marseilles

## 2015-04-26 NOTE — Progress Notes (Signed)
[facility] MD Progress Note  04/26/2015 1:26 PM Peggy Hooper  MRN:  [id number] Subjective:   Patient  Reports she is doing "OK" today. Denies any  Depression or any suicidal ideations at this time. Denies any  medication side effects. Objective : I have discussed case with treatment team and met with patient .  She denies depression and states she is feeling "OK". She  Reiterates that  Overdose was impulsive , not planned out , and not in the context of pre-existing depressive symptoms. At this time is looking forward to discharging soon.  On unit has been calm, pleasant, visible on unit- no disruptive or self injurious  Behaviors on unit . We again reviewed recent drug use- patient denies any history of cocaine use, but as noted,  UDS was positive for cocaine. We reviewed negative impact that drug use can have on mood and functioning level , and encouraged full abstinence from illicit drugs . Appreciate hospitalist involvement  Regarding anemia- of note, patient has no associated symptoms- no dyspnea, no dizziness or lightheadedness, no fatigue or weakness .   Principal Problem: Suicide attempt by drug ingestion Diagnosis:   Patient Active Problem List   Diagnosis Date Noted  . MDD (major depressive disorder), recurrent episode, severe [F33.2] 04/25/2015  . Suicide attempt by drug ingestion [T50.902A] 04/25/2015  . Normocytic anemia [D64.9] 04/25/2015  . Hypokalemia [E87.6] 04/25/2015   Total Time spent with patient: 25 minutes    Past Medical History:  Past Medical History  Diagnosis Date  . Medical history non-contributory     Past Surgical History  Procedure Laterality Date  . No past surgeries     Family History: History reviewed. No pertinent family history. Social History:  History  Alcohol Use No     History  Drug Use  . Yes  . Special: Marijuana    Social History   Social History  . Marital Status: Single    Spouse Name: N/A  . Number of Children: N/A  . Years of  Education: N/A   Social History Main Topics  . Smoking status: Never Smoker   . Smokeless tobacco: None  . Alcohol Use: No  . Drug Use: Yes    Special: Marijuana  . Sexual Activity: Not Asked   Other Topics Concern  . None   Social History Narrative   Additional History:    Sleep: Good  Appetite:  Good   Assessment:   Musculoskeletal: Strength & Muscle Tone: within normal limits Gait & Station: normal Patient leans: N/A   Psychiatric Specialty Exam: Physical Exam  ROS denies any bleeding , denies melenas, no shortness of breath, no chest pain, no abdominal pain, no tinnitus   Blood pressure 115/73, pulse 57, temperature 98 F (36.7 C), temperature source Oral, resp. rate 16, height 5' 7"  (1.702 m), weight 167 lb (75.751 kg), last menstrual period 04/19/2015.Body mass index is 26.15 kg/(m^2).  General Appearance: Well Groomed  Engineer, water::  Good  Speech:  Normal Rate  Volume:  Normal  Mood:  Euthymic- denies depression  Affect:  Appropriate  Thought Process:  Goal Directed and Linear  Orientation:  Full (Time, Place, and Person)  Thought Content:  no hallucinations, no delusions   Suicidal Thoughts:  No at this time denies any self injurious ideations or any SI  Homicidal Thoughts:  No  Memory:  recent and remote grossly intact   Judgement:  Fair  Insight:  improving   Psychomotor Activity:  Normal  Concentration:  Good  Recall:  Roel Cluck of Knowledge:Good  Language: Good  Akathisia:  Negative  Handed:  Right  AIMS (if indicated):     Assets:  Communication Skills Desire for Improvement Resilience Vocational/Educational  ADL's:  Intact  Cognition: WNL  Sleep:  Number of Hours: 6     Current Medications: Current Facility-Administered Medications  Medication Dose Route Frequency Provider Last Rate Last Dose  . acetaminophen (TYLENOL) tablet 650 mg  650 mg Oral Q6H PRN Laverle Hobby, PA-C      . alum & mag hydroxide-simeth (MAALOX/MYLANTA)  200-200-20 MG/5ML suspension 30 mL  30 mL Oral Q4H PRN Laverle Hobby, PA-C      . hydrOXYzine (ATARAX/VISTARIL) tablet 25 mg  25 mg Oral Q6H PRN Laverle Hobby, PA-C      . magnesium hydroxide (MILK OF MAGNESIA) suspension 30 mL  30 mL Oral Daily PRN Laverle Hobby, PA-C      . potassium chloride (K-DUR,KLOR-CON) CR tablet 10 mEq  10 mEq Oral TID Jenne Campus, MD   10 mEq at 04/26/15 1259  . traZODone (DESYREL) tablet 50 mg  50 mg Oral QHS,MR X 1 Laverle Hobby, PA-C   50 mg at 04/25/15 2159    Lab Results:  Results for orders placed or performed during the hospital encounter of 04/25/15 (from the past 48 hour(s))  Ferritin     Status: Abnormal   Collection Time: 04/25/15  7:35 PM  Result Value Ref Range   Ferritin 2 (L) 11 - 307 ng/mL    Comment: Performed at [facility]  Folate     Status: None   Collection Time: 04/25/15  7:35 PM  Result Value Ref Range   Folate 6.4 >5.9 ng/mL    Comment: Performed at [facility]  Iron and TIBC     Status: Abnormal   Collection Time: 04/25/15  7:35 PM  Result Value Ref Range   Iron 26 (L) 28 - 170 ug/dL   TIBC 472 (H) 250 - 450 ug/dL   Saturation Ratios 6 (L) 10.4 - 31.8 %   UIBC 446 ug/dL    Comment: Performed at [facility]  Reticulocytes     Status: Abnormal   Collection Time: 04/25/15  7:35 PM  Result Value Ref Range   Retic Ct Pct 1.5 0.4 - 3.1 %   RBC. 3.53 (L) 3.87 - 5.11 MIL/uL   Retic Count, Manual 53.0 19.0 - 186.0 K/uL    Comment: Performed at [facility]  Vitamin B12     Status: None   Collection Time: 04/25/15  7:35 PM  Result Value Ref Range   Vitamin B-12 240 180 - 914 pg/mL    Comment: (NOTE) This assay is not validated for testing neonatal or myeloproliferative syndrome specimens for Vitamin B12 levels. Performed at [facility]   CBC     Status: Abnormal   Collection Time: 04/26/15  6:30 AM  Result Value Ref Range   WBC 4.0 4.0 - 10.5 K/uL   RBC 3.67 (L)  3.87 - 5.11 MIL/uL   Hemoglobin 9.6 (L) 12.0 - 15.0 g/dL   HCT 30.8 (L) 36.0 - 46.0 %   MCV 83.9 78.0 - 100.0 fL   MCH 26.2 26.0 - 34.0 pg   MCHC 31.2 30.0 - 36.0 g/dL   RDW 17.2 (H) 11.5 - 15.5 %   Platelets 293 150 - 400 K/uL    Comment: Performed at [facility]  Basic metabolic panel     Status: Abnormal   Collection Time: 04/26/15  6:30 AM  Result Value Ref Range   Sodium 138 135 - 145 mmol/L   Potassium 3.3 (L) 3.5 - 5.1 mmol/L   Chloride 107 101 - 111 mmol/L   CO2 27 22 - 32 mmol/L   Glucose, Bld 91 65 - 99 mg/dL   BUN 9 6 - 20 mg/dL   Creatinine, Ser 0.90 0.44 - 1.00 mg/dL   Calcium 8.8 (L) 8.9 - 10.3 mg/dL   GFR calc non Af Amer >60 >60 mL/min   GFR calc Af Amer >60 >60 mL/min    Comment: (NOTE) The eGFR has been calculated using the CKD EPI equation. This calculation has not been validated in all clinical situations. eGFR's persistently <60 mL/min signify possible Chronic Kidney Disease.    Anion gap 4 (L) 5 - 15    Comment: Performed at [facility]    Physical Findings: AIMS: Facial and Oral Movements Muscles of Facial Expression: None, normal Lips and Perioral Area: None, normal Jaw: None, normal Tongue: None, normal,Extremity Movements Upper (arms, wrists, hands, fingers): None, normal Lower (legs, knees, ankles, toes): None, normal, Trunk Movements Neck, shoulders, hips: None, normal, Overall Severity Severity of abnormal movements (highest score from questions above): None, normal Incapacitation due to abnormal movements: None, normal Patient's awareness of abnormal movements (rate only patient's report): No Awareness, Dental Status Current problems with teeth and/or dentures?: No Does patient usually wear dentures?: No  CIWA:  CIWA-Ar Total: 1 COWS:  COWS Total Score: 1   Assessment- at this time mood euthymic, affect full in range, no suicidal ideations, denying any depression.  Anemia being worked up, not  associated with any current symptoms so likely chronic. Appreciate hospitalist consultation. Receptive to education regarding importance of  Remaining abstinent from illicit substances- as noted, UDS positive for cocaine , which patient denies using or abusing , which coincided with increased impulsivity/ overdose .   Treatment Plan Summary: Daily contact with patient to assess and evaluate symptoms and progress in treatment, Medication management, Plan inpatient treatment  and medications/ treatment as below Currently on no standing psychiatric medications- none warranted at this time. Anticipate discharge soon as she  Continues to stabilize. Continue work up [place] [place] of anemia as per hospitalist service . Continue to encourage abstinence from illicit drugs  KDUR to address hypokalemia. Hydroxyzine PRNs for Anxiety, as needed  Trazodone 50 mgrs QHS  PRN for insomnia , as needed .   Medical Decision Making:  Established Problem, Stable/Improving (1), New problem, with additional work up planned, Review of Psycho-Social Stressors (1), Review or order clinical lab tests (1) and Review of Medication Regimen & Side Effects (2)     Kasheem Toner 04/26/2015, 1:26 PM

## 2015-04-26 NOTE — Progress Notes (Signed)
D: Peggy Hooper is very euphoric/cheerful during our discussion. She is smiling and laughing while talking with her roommate. She states today was "full of excitement". She was noted in the day room to be hyperactive and very talkative with most of the patients. She had visitors today and states the visit was great. Her goal for today was to remain positive and she feels like she met her goal for today. She denies and thoughts of harming herself and contracts for safety at this time. She has no complaints or concerns at this time.  A: Instructed Peggy Hooper to work on her goal for tomorrow and we will discuss her goal prior to bedtime.  R: Will continue to monitor for patient safety and any mood disturbances.

## 2015-04-27 DIAGNOSIS — T50902A Poisoning by unspecified drugs, medicaments and biological substances, intentional self-harm, initial encounter: Secondary | ICD-10-CM | POA: Diagnosis not present

## 2015-04-27 DIAGNOSIS — F332 Major depressive disorder, recurrent severe without psychotic features: Secondary | ICD-10-CM | POA: Diagnosis not present

## 2015-04-27 MED ORDER — TRAZODONE HCL 50 MG PO TABS: 50.0000 mg | ORAL_TABLET | Freq: Every evening | ORAL | Status: DC | PRN

## 2015-04-27 MED ORDER — HYDROXYZINE HCL 25 MG PO TABS: 25.0000 mg | ORAL_TABLET | Freq: Four times a day (QID) | ORAL | Status: DC | PRN

## 2015-04-27 NOTE — BHH Suicide Risk Assessment (Signed)
[facility] Discharge Suicide Risk Assessment   Demographic Factors:  24 year old single female, employed, and enrolled in college   Total Time spent with patient: 30 minutes  Musculoskeletal: Strength & Muscle Tone: within normal limits Gait & Station: normal Patient leans: N/A  Psychiatric Specialty Exam: Physical Exam  ROS  Blood pressure 115/71, pulse 62, temperature 97.6 F (36.4 C), temperature source Oral, resp. rate 16, height 5\' 7"  (1.702 m), weight 167 lb (75.751 kg), last menstrual period 04/19/2015.Body mass index is 26.15 kg/(m^2).  General Appearance: Well Groomed  Patent attorney::  Good  Speech:  Normal Rate409  Volume:  Normal  Mood:  Euthymic  Affect:  Appropriate and Full Range  Thought Process:  Linear  Orientation:  Full (Time, Place, and Person)  Thought Content:  no hallucinations, no delusions  Suicidal Thoughts:  No- denies any self injurious ideations and no suicidal ideations  Homicidal Thoughts:  No  Memory:  recent and remote grossly intact   Judgement:  Good  Insight:  Good  Psychomotor Activity:  Normal  Concentration:  Good  Recall:  Good  Fund of Knowledge:Good  Language: Good  Akathisia:  Negative  Handed:  Right  AIMS (if indicated):     Assets:  Communication Skills Desire for Improvement Physical Health Resilience Social Support Vocational/Educational  Sleep:  Number of Hours: 6  Cognition: WNL  ADL's:  Intact   Have you used any form of tobacco in the last 30 days? (Cigarettes, Smokeless Tobacco, Cigars, and/or Pipes): No  Has this patient used any form of tobacco in the last 30 days? (Cigarettes, Smokeless Tobacco, Cigars, and/or Pipes) No  Mental Status Per Nursing Assessment::   On Admission:     Current Mental Status by Physician: At this time patient is feeling " fine". Mood is euthymic, affect is bright and full in range, no thought disorder, no SI, no HI, no psychotic symptoms and she is future oriented,looking forward to  returning to work soon and to college as of next week.   Loss Factors: Family stressors   Historical Factors:  denies any prior history of severe depression or of prior suicide attempts, history of cannabis abuse .   Risk Reduction Factors:   Sense of responsibility to family, Employed, Positive social support and Positive coping skills or problem solving skills  Continued Clinical Symptoms:  At this time she is euthymic, with bright affect, no SI or HI.   Cognitive Features That Contribute To Risk:  No gross cognitive deficits noted upon discharge. Is alert , attentive, and oriented x 3   Suicide Risk:  Mild:  Suicidal ideation of limited frequency, intensity, duration, and specificity.  There are no identifiable plans, no associated intent, mild dysphoria and related symptoms, good self-control (both objective and subjective assessment), few other risk factors, and identifiable protective factors, including available and accessible social support.  Principal Problem: Suicide attempt by drug ingestion Discharge Diagnoses:  Patient Active Problem List   Diagnosis Date Noted  . MDD (major depressive disorder), recurrent episode, severe [F33.2] 04/25/2015  . Suicide attempt by drug ingestion [T50.902A] 04/25/2015  . Normocytic anemia [D64.9] 04/25/2015  . Hypokalemia [E87.6] 04/25/2015    Follow-up Information    Follow up with [facility] On 05/04/2015.   Why:  at 9:00am for therapy. Please call the number listed below to reschedule if this time does not work for you.   Contact information:   [facility] [address]  [address] [place]  [address] [place], [place] [address] PHONE: [phone]      Plan Of Care/Follow-up recommendations:  Activity:  as tolerated  Diet:  Regular Tests:  NA Other:  See below   Is patient on multiple antipsychotic therapies at discharge:  No   Has Patient had three or more failed trials of antipsychotic monotherapy by  history:  No  Recommended Plan for Multiple Antipsychotic Therapies: NA   Patient is leaving unit in good spirits. She plans to follow up as above. She is encouraged to continue iron supplementation and to follow up with her PCP. She is encouraged to abstain from illicit drugs .    COBOS, FERNANDO 04/27/2015, 9:13 AM

## 2015-04-27 NOTE — Progress Notes (Signed)
  [facility] Adult Case Management Discharge Plan :  Will you be returning to the same living situation after discharge:  Yes,  returning to her apartment At discharge, do you have transportation home?: Yes,  boyfriend will provide transportation Do you have the ability to pay for your medications: Yes,  Pt provided with samples  Release of information consent forms completed and in the chart;  Patient's signature needed at discharge.  Patient to Follow up at: Follow-up Information    Follow up with [facility] On 05/04/2015.   Why:  at 9:00am for therapy. Please call the number listed below to reschedule if this time does not work for you.   Contact information:   [facility] [address]  [address] [place], [place] [address] PHONE: [phone]      Patient denies SI/HI: Yes,  Pt denies    Safety Planning and Suicide Prevention discussed: Yes,  with boyfriend. See [facility] note for further detail  Have you used any form of tobacco in the last 30 days? (Cigarettes, Smokeless Tobacco, Cigars, and/or Pipes): No  Has patient been referred to the Quitline?: N/A patient is not a smoker  Elaina Hoops 04/27/2015, 9:28 AM

## 2015-04-27 NOTE — Discharge Summary (Signed)
Physician Discharge Summary Note  Patient:  Peggy Hooper is an 24 y.o., female MRN:  [id number] DOB:  Sep 27, 1990 Patient phone:  [phone] (home)  Patient address:   [address]  [place] [address],  Total Time spent with patient: 45 minutes  Date of Admission:  04/25/2015 Date of Discharge: 04/27/2015  Reason for Admission:  Suicide attempt  Principal Problem: Suicide attempt by drug ingestion Discharge Diagnoses: Patient Active Problem List   Diagnosis Date Noted  . MDD (major depressive disorder), recurrent episode, severe [F33.2] 04/25/2015  . Suicide attempt by drug ingestion [T50.902A] 04/25/2015  . Normocytic anemia [D64.9] 04/25/2015  . Hypokalemia [E87.6] 04/25/2015    Musculoskeletal: Strength & Muscle Tone: within normal limits Gait & Station: normal Patient leans: N/A  Psychiatric Specialty Exam: Physical Exam  Vitals reviewed. Psychiatric: She has a normal mood and affect. She does not exhibit a depressed mood. She expresses no homicidal and no suicidal ideation.    Review of Systems  Constitutional: Negative for fever.  Eyes: Negative for blurred vision.  Respiratory: Positive for cough.   Cardiovascular: Negative for chest pain.  Gastrointestinal: Negative for heartburn.  Genitourinary: Negative for dysuria.  Skin: Negative for rash.  Neurological: Negative for headaches.    Blood pressure 115/71, pulse 62, temperature 97.6 F (36.4 C), temperature source Oral, resp. rate 16, height 5' 7"  (1.702 m), weight 75.751 kg (167 lb), last menstrual period 04/19/2015.Body mass index is 26.15 kg/(m^2).   General Appearance: Well Groomed  Engineer, water:: Good  Speech: Normal Rate409  Volume: Normal  Mood: Euthymic  Affect: Appropriate and Full Range  Thought Process: Linear  Orientation: Full (Time, Place, and Person)  Thought Content: no hallucinations, no delusions  Suicidal Thoughts: No- denies any self injurious ideations and no  suicidal ideations  Homicidal Thoughts: No  Memory: recent and remote grossly intact   Judgement: Good  Insight: Good  Psychomotor Activity: Normal  Concentration: Good  Recall: Good  Fund of Knowledge:Good  Language: Good  Akathisia: Negative  Handed: Right  AIMS (if indicated):    Assets: Communication Skills Desire for Improvement Physical Health Resilience Social Support Vocational/Educational  Sleep: Number of Hours: 6  Cognition: WNL  ADL's: Intact       Have you used any form of tobacco in the last 30 days? (Cigarettes, Smokeless Tobacco, Cigars, and/or Pipes): No  Has this patient used any form of tobacco in the last 30 days? (Cigarettes, Smokeless Tobacco, Cigars, and/or Pipes) N/A  Past Medical History:  Past Medical History  Diagnosis Date  . Medical history non-contributory     Past Surgical History  Procedure Laterality Date  . No past surgeries     Family History: History reviewed. No pertinent family history. Social History:  History  Alcohol Use No     History  Drug Use  . Yes  . Special: Marijuana    Social History   Social History  . Marital Status: Single    Spouse Name: N/A  . Number of Children: N/A  . Years of Education: N/A   Social History Main Topics  . Smoking status: Never Smoker   . Smokeless tobacco: None  . Alcohol Use: No  . Drug Use: Yes    Special: Marijuana  . Sexual Activity: Not Asked   Other Topics Concern  . 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   States that before the day described above, she was "  doing all-right" and was not feeling particularly depressed.  Of note, patient smokes cannabis regularly- 2-3 x a week. She denies any alcohol or other drug abuse.  However, UDS is positive for cannabis and for cocaine.   Peggy Hooper was admitted for Suicide attempt by drug ingestion and crisis management.  She was treated discharged with the medications listed below under Medication List.  Medical problems were identified and treated as needed.  Home medications were restarted as appropriate.  Improvement was monitored by observation and Peggy Hooper daily report of symptom reduction.  Emotional and mental status was monitored by daily self-inventory reports completed by Peggy Hooper and clinical staff.         Peggy Hooper was evaluated by the treatment team for stability and plans for continued recovery upon discharge.  Peggy Hooper motivation was an integral factor for scheduling further treatment.  Employment, transportation, bed availability, health status, family support, and any pending legal issues were also considered during her hospital stay.  She was offered further treatment options upon discharge including but not limited to Residential, Intensive Outpatient, and Outpatient treatment.  Peggy Hooper will follow up with the services as listed below under Follow Up Information.     Upon completion of this admission the patient was both mentally and medically stable for discharge denying suicidal/homicidal ideation, auditory/visual/tactile hallucinations, delusional thoughts and paranoia.     Consults:  psychiatry  Significant Diagnostic Studies:  labs: per ED  Discharge Vitals:   Blood pressure 115/71, pulse 62, temperature 97.6 F (36.4 C), temperature source Oral, resp. rate 16, height 5' 7"  (1.702 m), weight 75.751 kg (167 lb), last menstrual period 04/19/2015. Body mass index is 26.15 kg/(m^2). Lab Results:   Results for orders placed or performed during the hospital encounter  of 04/25/15 (from the past 72 hour(s))  Ferritin     Status: Abnormal   Collection Time: 04/25/15  7:35 PM  Result Value Ref Range   Ferritin 2 (L) 11 - 307 ng/mL    Comment: Performed at [facility]  Folate     Status: None   Collection Time: 04/25/15  7:35 PM  Result Value Ref Range   Folate 6.4 >5.9 ng/mL    Comment: Performed at [facility]  Iron and TIBC     Status: Abnormal   Collection Time: 04/25/15  7:35 PM  Result Value Ref Range   Iron 26 (L) 28 - 170 ug/dL   TIBC 472 (H) 250 - 450 ug/dL   Saturation Ratios 6 (L) 10.4 - 31.8 %   UIBC 446 ug/dL    Comment: Performed at [facility]  Reticulocytes     Status: Abnormal   Collection Time: 04/25/15  7:35 PM  Result Value Ref Range   Retic Ct Pct 1.5 0.4 - 3.1 %   RBC. 3.53 (L) 3.87 - 5.11 MIL/uL   Retic Count, Manual 53.0 19.0 - 186.0 K/uL    Comment: Performed at [facility]  Vitamin B12     Status: None   Collection Time: 04/25/15  7:35 PM  Result  Value Ref Range   Vitamin B-12 240 180 - 914 pg/mL    Comment: (NOTE) This assay is not validated for testing neonatal or myeloproliferative syndrome specimens for Vitamin B12 levels. Performed at [facility]   CBC     Status: Abnormal   Collection Time: 04/26/15  6:30 AM  Result Value Ref Range   WBC 4.0 4.0 - 10.5 K/uL   RBC 3.67 (L) 3.87 - 5.11 MIL/uL   Hemoglobin 9.6 (L) 12.0 - 15.0 g/dL   HCT 30.8 (L) 36.0 - 46.0 %   MCV 83.9 78.0 - 100.0 fL   MCH 26.2 26.0 - 34.0 pg   MCHC 31.2 30.0 - 36.0 g/dL   RDW 17.2 (H) 11.5 - 15.5 %   Platelets 293 150 - 400 K/uL    Comment: Performed at [facility] metabolic panel     Status: Abnormal   Collection Time: 04/26/15  6:30 AM  Result Value Ref Range   Sodium 138 135 - 145 mmol/L   Potassium 3.3 (L) 3.5 - 5.1 mmol/L   Chloride 107 101 - 111 mmol/L   CO2 27 22 - 32 mmol/L   Glucose, Bld 91 65 - 99 mg/dL   BUN 9 6 - 20 mg/dL   Creatinine, Ser  0.90 0.44 - 1.00 mg/dL   Calcium 8.8 (L) 8.9 - 10.3 mg/dL   GFR calc non Af Amer >60 >60 mL/min   GFR calc Af Amer >60 >60 mL/min    Comment: (NOTE) The eGFR has been calculated using the CKD EPI equation. This calculation has not been validated in all clinical situations. eGFR's persistently <60 mL/min signify possible Chronic Kidney Disease.    Anion gap 4 (L) 5 - 15    Comment: Performed at [facility]  Occult blood card to lab, stool     Status: None   Collection Time: 04/26/15  7:30 PM  Result Value Ref Range   Fecal Occult Bld NEGATIVE NEGATIVE    Comment: Performed at [facility]    Physical Findings: AIMS: Facial and Oral Movements Muscles of Facial Expression: None, normal Lips and Perioral Area: None, normal Jaw: None, normal Tongue: None, normal,Extremity Movements Upper (arms, wrists, hands, fingers): None, normal Lower (legs, knees, ankles, toes): None, normal, Trunk Movements Neck, shoulders, hips: None, normal, Overall Severity Severity of abnormal movements (highest score from questions above): None, normal Incapacitation due to abnormal movements: None, normal Patient's awareness of abnormal movements (rate only patient's report): No Awareness, Dental Status Current problems with teeth and/or dentures?: No Does patient usually wear dentures?: No  CIWA:  CIWA-Ar Total: 0 COWS:  COWS Total Score: 0   See Psychiatric Specialty Exam and Suicide Risk Assessment completed by Attending Physician prior to discharge.  Discharge destination:  Home  Is patient on multiple antipsychotic therapies at discharge:  No   Has Patient had three or more failed trials of antipsychotic monotherapy by history:  No  Recommended Plan for Multiple Antipsychotic Therapies: NA    Medication List    STOP taking these medications        aspirin 325 MG tablet      TAKE these medications      Indication   hydrOXYzine 25 MG tablet   Commonly known as:  ATARAX/VISTARIL  Take 1 tablet (25 mg total) by mouth every 6 (six) hours as needed for anxiety.   Indication:  Anxiety Neurosis     traZODone 50 MG  tablet  Commonly known as:  DESYREL  Take 1 tablet (50 mg total) by mouth at bedtime and may repeat dose one time if needed.   Indication:  Trouble Sleeping           Follow-up Information    Follow up with [facility] On 05/04/2015.   Why:  at 9:00am for therapy. Please call the number listed below to reschedule if this time does not work for you.   Contact information:   [facility] [address]  [place], [place], [place] [address] PHONE: [phone]      Follow-up recommendations:  Activity:  as tol, diet as tol  Comments:  1.  Take all your medications as prescribed.              2.  Report any adverse side effects to outpatient provider.                       3.  Patient instructed to not use alcohol or illegal drugs while on prescription medicines.            4.  In the event of worsening symptoms, instructed patient to call 911, the crisis hotline or go to nearest emergency room for evaluation of symptoms.  Total Discharge Time: 40 min  Signed: Freda Munro May Agustin AGNP-BC 04/27/2015, 4:29 PM   Patient seen, Suicide Assessment Completed.  Disposition Plan Reviewed

## 2015-04-27 NOTE — Progress Notes (Signed)
Met with patient 1:1 this morning. She is pleasant and appropriate. Denying all psychiatric symptoms rating depression, hopelessness and anxiety all at a 0/10. States she is ready for discharge. Medicated per orders. Reviewed fall precautions as patient's hemoglobin is low. Patient verbalizes understanding. Denies SI/HI. Awaiting discharge order. Peggy Hooper

## 2015-04-27 NOTE — Tx Team (Signed)
Interdisciplinary Treatment Plan Update (Adult) Date: 04/27/2015   Date: 04/27/2015 1:45 PM  Progress in Treatment:  Attending groups: Yes  Participating in groups: Yes Taking medication as prescribed: Yes  Tolerating medication: Yes  Family/Significant othe contact made: Yes, with boyfriend Patient understands diagnosis: Yes Discussing patient identified problems/goals with staff: Yes  Medical problems stabilized or resolved: Yes  Denies suicidal/homicidal ideation: Yes Patient has not harmed self or Others: Yes   New problem(s) identified: None identified at this time.   Discharge Plan or Barriers: CSW will assess for appropriate discharge plan and relevant barriers.   04/27/15: Pt returning home and will follow-up with [facility].  Additional comments: n/a   Reason for Continuation of Hospitalization:  Depression Medication stabilization Suicidal ideation  Estimated length of stay: 0 days; stable for DC today  Review of initial/current patient goals per problem list:   1.  Goal(s): Patient will participate in aftercare plan  Met:  Yes  Target date: 3-5 days from date of admission   As evidenced by: Patient will participate within aftercare plan AEB aftercare provider and housing plan at discharge being identified.  04/25/15: CSW to work with Pt to assess for appropriate discharge plan and faciliate appointments and referrals as needed prior to d/c. 04/25/15: Pt returning home and will follow-up with outpatient providers  2.  Goal (s): Patient will exhibit decreased depressive symptoms and suicidal ideations.  Met:  Yes  Target date:3-5 days from date of admission   As evidenced by: Patient will utilize self rating of depression at 3 or below and demonstrate decreased signs of depression or be deemed stable for discharge by MD. Pt was admitted with symptoms of depression, rating 10/10. Pt continues to present with flat affect and depressive symptoms.  Pt  will demonstrate decreased symptoms of depression and rate depression at 3/10 or lower prior to discharge. 04/27/15: Pt rates depression at 0/10 and denies SI. Affect and mood improved.   Attendees:  Patient:    Family:    Physician: Dr. Parke Poisson, MD  04/27/2015 1:45 PM  Nursing: Lars Pinks, RN Case manager  04/27/2015 1:45 PM  Clinical Social Worker Peri Maris, Latanya Presser, MSW 04/27/2015 1:45 PM  Other: Lucinda Dell, Beverly Sessions Liasion 04/27/2015 1:45 PM  Clinical:  Jinny Sanders RN; Darrol Angel, RN 04/27/2015 1:45 PM  Other: , RN Charge Nurse 04/27/2015 1:45 PM  Other:     Peri Maris, [place] MSW

## 2015-04-27 NOTE — [facility] Group Notes (Signed)
[facility] Group Notes:  (Nursing/MHT/Case Management/Adjunct)  Date:  04/27/2015  Time:  0900  Type of Therapy:  Nurse Education  Participation Level:  Active  Participation Quality:  Appropriate  Affect:  Appropriate  Cognitive:  Alert  Insight:  Appropriate  Engagement in Group:  Engaged  Modes of Intervention:  Education  Summary of Progress/Problems:  Educated patient on goal setting and making positive changes. Also being mindful of self esteem and negative self talk that can sabotage recovery.     Merian [facility] 04/27/2015, 0930

## 2015-04-27 NOTE — Progress Notes (Signed)
Patient packed and verbalizes readiness to discharge. D/C teaching explained, Rx's given along with sample meds. No belongings in locker. Patient verbalizes understanding. Denies SI/HI and assures this Clinical research associate she will reach out should that status change. Discharged in stable condition to friend. Lawrence Marseilles

## 2015-08-20 ENCOUNTER — Emergency Department (HOSPITAL_COMMUNITY)
Admission: EM | Admit: 2015-08-20 | Discharge: 2015-08-20 | Disposition: A | Discharge status: Home / Self Care | Payer: Medicaid Other | Attending: Emergency Medicine | Admitting: Emergency Medicine

## 2015-08-20 ENCOUNTER — Encounter (HOSPITAL_COMMUNITY): Payer: Self-pay

## 2015-08-20 DIAGNOSIS — O210 Mild hyperemesis gravidarum: Secondary | ICD-10-CM | POA: Insufficient documentation

## 2015-08-20 DIAGNOSIS — Z3A01 Less than 8 weeks gestation of pregnancy: Secondary | ICD-10-CM | POA: Diagnosis not present

## 2015-08-20 DIAGNOSIS — Z3490 Encounter for supervision of normal pregnancy, unspecified, unspecified trimester: Secondary | ICD-10-CM

## 2015-08-20 LAB — URINALYSIS, ROUTINE W REFLEX MICROSCOPIC
BILIRUBIN URINE: NEGATIVE
Glucose, UA: NEGATIVE mg/dL
Hgb urine dipstick: NEGATIVE
KETONES UR: NEGATIVE mg/dL
Leukocytes, UA: NEGATIVE
NITRITE: NEGATIVE
PH: 6 (ref 5.0–8.0)
PROTEIN: NEGATIVE mg/dL
Specific Gravity, Urine: 1.026 (ref 1.005–1.030)

## 2015-08-20 LAB — POC URINE PREG, ED: PREG TEST UR: POSITIVE — AB

## 2015-08-20 MED ORDER — ONDANSETRON 4 MG PO TBDP
4.0000 mg | ORAL_TABLET | Freq: Once | ORAL | Status: AC
Start: 2015-08-20 — End: 2015-08-20
  Administered 2015-08-20: 4 mg via ORAL
  Filled 2015-08-20: qty 1

## 2015-08-20 MED ORDER — PRENATAL COMPLETE 14-0.4 MG PO TABS: 1.0000 | ORAL_TABLET | Freq: Two times a day (BID) | ORAL | Status: DC

## 2015-08-20 NOTE — ED Provider Notes (Signed)
CSN: [id number]     Arrival date & time 08/20/15  0944 History   First MD Initiated Contact with Patient 08/20/15 [phone]     Chief Complaint  Patient presents with  . Emesis     (Consider location/radiation/quality/duration/timing/severity/associated sxs/prior Treatment) HPI   Patient to the ER for evaluation of vomiting x 2 without any other symptoms aside from nausea. She has had no diarrhea, no abdominal pain. No vaginal discharge, bleeding, cramping. No back pain, fevers, coughing, SOB, weakness.  Her LMP was the beginning of November, she typically uses protection but did not a few times a month and a half ago.  Past Medical History  Diagnosis Date  . Medical history non-contributory    Past Surgical History  Procedure Laterality Date  . No past surgeries     No family history on file. Social History  Substance Use Topics  . Smoking status: Never Smoker   . Smokeless tobacco: None  . Alcohol Use: No   OB History    No data available     Review of Systems  Refer to HPI for pertinent positive and negative ROS. Otherwise all other review of systems are negative for this patient encounter.   Allergies  Review of patient's allergies indicates no known allergies.  Home Medications   Prior to Admission medications   Medication Sig Start Date End Date Taking? Authorizing Provider  hydrOXYzine (ATARAX/VISTARIL) 25 MG tablet Take 1 tablet (25 mg total) by mouth every 6 (six) hours as needed for anxiety. Patient not taking: Reported on 08/20/2015 04/27/15   Adonis Brook, NP  Prenatal Vit-Fe Fumarate-FA (PRENATAL COMPLETE) 14-0.4 MG TABS Take 1 tablet by mouth 2 (two) times daily. 08/20/15   Lazarus Sudbury Neva Seat, PA-C  traZODone (DESYREL) 50 MG tablet Take 1 tablet (50 mg total) by mouth at bedtime and may repeat dose one time if needed. Patient not taking: Reported on 08/20/2015 04/27/15   Adonis Brook, NP   BP 124/72 mmHg  Pulse 78  Temp(Src) 98.6 F (37 C) (Oral)  Resp  16  SpO2 99%  LMP 07/11/2015 (Approximate) Physical Exam  Constitutional: She appears well-developed and well-nourished. No distress.  HENT:  Head: Normocephalic and atraumatic.  Eyes: Pupils are equal, round, and reactive to light.  Neck: Normal range of motion. Neck supple.  Cardiovascular: Normal rate and regular rhythm.   Pulmonary/Chest: Effort normal.  Abdominal: Soft. Bowel sounds are normal. She exhibits no distension. There is no tenderness. There is no rigidity, no rebound and no guarding.  Neurological: She is alert.  Skin: Skin is warm and dry.  Nursing note and vitals reviewed.   ED Course  Procedures (including critical care time) Labs Review Labs Reviewed  POC URINE PREG, ED - Abnormal; Notable for the following:    Preg Test, Ur POSITIVE (*)    All other components within normal limits  URINALYSIS, ROUTINE W REFLEX MICROSCOPIC (NOT AT [facility])    Imaging Review No results found. I have personally reviewed and evaluated these images and lab results as part of my medical decision-making.   EKG Interpretation None      MDM   Final diagnoses:  Pregnant  Morning sickness    Pt is well appearing. Positive pregnancy test in the ED, normal UA. The patient is not having belly pain, back pain, bleeding or cramping to warrant further evaluation at this time. Started on Prenatals, referred to [facility] outpatient clinic.The patient asked me to tell her boyfriend who I had asked step out.  I told him, the pair do not seem happy about the pregnancy at this time.  Medications  ondansetron (ZOFRAN-ODT) disintegrating tablet 4 mg (4 mg Oral Given 08/20/15 1011)     I feel the patient has had an appropriate workup for their chief complaint at this time and likelihood of emergent condition existing is low. Discussed s/sx that warrant return to the ED.  Filed Vitals:   08/20/15 0949  BP: 124/72  Pulse: 78  Temp: 98.6 F (37 C)  Resp: [id number]       Arnella Pralle,  PA-C 08/20/15 1109  Tilden FossaElizabeth Rees, MD 08/21/15 0700

## 2015-08-20 NOTE — Discharge Instructions (Signed)
Morning Sickness °Morning sickness is when you feel sick to your stomach (nauseous) during pregnancy. This nauseous feeling may or may not come with vomiting. It often occurs in the morning but can be a problem any time of day. Morning sickness is most common during the first trimester, but it may continue throughout pregnancy. While morning sickness is unpleasant, it is usually harmless unless you develop severe and continual vomiting (hyperemesis gravidarum). This condition requires more intense treatment.  °CAUSES  °The cause of morning sickness is not completely known but seems to be related to normal hormonal changes that occur in pregnancy. °RISK FACTORS °You are at greater risk if you: °· Experienced nausea or vomiting before your pregnancy. °· Had morning sickness during a previous pregnancy. °· Are pregnant with more than one baby, such as twins. °TREATMENT  °Do not use any medicines (prescription, over-the-counter, or herbal) for morning sickness without first talking to your health care provider. Your health care provider may prescribe or recommend: °· Vitamin B6 supplements. °· Anti-nausea medicines. °· The herbal medicine ginger. °HOME CARE INSTRUCTIONS  °· Only take over-the-counter or prescription medicines as directed by your health care provider. °· Taking multivitamins before getting pregnant can prevent or decrease the severity of morning sickness in most women. °· Eat a piece of dry toast or unsalted crackers before getting out of bed in the morning. °· Eat five or six small meals a day. °· Eat dry and bland foods (rice, baked potato). Foods high in carbohydrates are often helpful. °· Do not drink liquids with your meals. Drink liquids between meals. °· Avoid greasy, fatty, and spicy foods. °· Get someone to cook for you if the smell of any food causes nausea and vomiting. °· If you feel nauseous after taking prenatal vitamins, take the vitamins at night or with a snack.  °· Snack on protein  foods (nuts, yogurt, cheese) between meals if you are hungry. °· Eat unsweetened gelatins for desserts. °· Wearing an acupressure wristband (worn for sea sickness) may be helpful. °· Acupuncture may be helpful. °· Do not smoke. °· Get a humidifier to keep the air in your house free of odors. °· Get plenty of fresh air. °SEEK MEDICAL CARE IF:  °· Your home remedies are not working, and you need medicine. °· You feel dizzy or lightheaded. °· You are losing weight. °SEEK IMMEDIATE MEDICAL CARE IF:  °· You have persistent and uncontrolled nausea and vomiting. °· You pass out (faint). °MAKE SURE YOU: °· Understand these instructions. °· Will watch your condition. °· Will get help right away if you are not doing well or get worse. °  °This information is not intended to replace advice given to you by your health care provider. Make sure you discuss any questions you have with your health care provider. °  °Document Released: 10/17/2006 Document Revised: 08/31/2013 Document Reviewed: 02/10/2013 °Elsevier Interactive Patient Education ©2016 [employer] °First Trimester of Pregnancy °The first trimester of pregnancy is from week 1 until the end of week 12 (months 1 through 3). A week after a sperm fertilizes an egg, the egg will implant on the wall of the uterus. This embryo will begin to develop into a baby. Genes from you and your partner are forming the baby. The female genes determine whether the baby is a boy or a girl. At 6-8 weeks, the eyes and face are formed, and the heartbeat can be seen on ultrasound. At the end of 12 weeks, all the baby's organs   are formed.  °Now that you are pregnant, you will want to do everything you can to have a healthy baby. Two of the most important things are to get good prenatal care and to follow your health care provider's instructions. Prenatal care is all the medical care you receive before the baby's birth. This care will help prevent, find, and treat any problems during the  pregnancy and childbirth. °BODY CHANGES °Your body goes through many changes during pregnancy. The changes vary from woman to woman.  °· You may gain or lose a couple of pounds at first. °· You may feel sick to your stomach (nauseous) and throw up (vomit). If the vomiting is uncontrollable, call your health care provider. °· You may tire easily. °· You may develop headaches that can be relieved by medicines approved by your health care provider. °· You may urinate more often. Painful urination may mean you have a bladder infection. °· You may develop heartburn as a result of your pregnancy. °· You may develop constipation because certain hormones are causing the muscles that push waste through your intestines to slow down. °· You may develop hemorrhoids or swollen, bulging veins (varicose veins). °· Your breasts may begin to grow larger and become tender. Your nipples may stick out more, and the tissue that surrounds them (areola) may become darker. °· Your gums may bleed and may be sensitive to brushing and flossing. °· Dark spots or blotches (chloasma, mask of pregnancy) may develop on your face. This will likely fade after the baby is born. °· Your menstrual periods will stop. °· You may have a loss of appetite. °· You may develop cravings for certain kinds of food. °· You may have changes in your emotions from day to day, such as being excited to be pregnant or being concerned that something may go wrong with the pregnancy and baby. °· You may have more vivid and strange dreams. °· You may have changes in your hair. These can include thickening of your hair, rapid growth, and changes in texture. Some women also have hair loss during or after pregnancy, or hair that feels dry or thin. Your hair will most likely return to normal after your baby is born. °WHAT TO EXPECT AT YOUR PRENATAL VISITS °During a routine prenatal visit: °· You will be weighed to make sure you and the baby are growing normally. °· Your blood  pressure will be taken. °· Your abdomen will be measured to track your baby's growth. °· The fetal heartbeat will be listened to starting around week 10 or 12 of your pregnancy. °· Test results from any previous visits will be discussed. °Your health care provider may ask you: °· How you are feeling. °· If you are feeling the baby move. °· If you have had any abnormal symptoms, such as leaking fluid, bleeding, severe headaches, or abdominal cramping. °· If you are using any tobacco products, including cigarettes, chewing tobacco, and electronic cigarettes. °· If you have any questions. °Other tests that may be performed during your first trimester include: °· Blood tests to find your blood type and to check for the presence of any previous infections. They will also be used to check for low iron levels (anemia) and Rh antibodies. Later in the pregnancy, blood tests for diabetes will be done along with other tests if problems develop. °· Urine tests to check for infections, diabetes, or protein in the urine. °· An ultrasound to confirm the proper growth and development of   the baby. °· An amniocentesis to check for possible genetic problems. °· Fetal screens for spina bifida and Down syndrome. °· You may need other tests to make sure you and the baby are doing well. °· HIV (human immunodeficiency virus) testing. Routine prenatal testing includes screening for HIV, unless you choose not to have this test. °HOME CARE INSTRUCTIONS  °Medicines °· Follow your health care provider's instructions regarding medicine use. Specific medicines may be either safe or unsafe to take during pregnancy. °· Take your prenatal vitamins as directed. °· If you develop constipation, try taking a stool softener if your health care provider approves. °Diet °· Eat regular, well-balanced meals. Choose a variety of foods, such as meat or vegetable-based protein, fish, milk and low-fat dairy products, vegetables, fruits, and whole grain breads  and cereals. Your health care provider will help you determine the amount of weight gain that is right for you. °· Avoid raw meat and uncooked cheese. These carry germs that can cause birth defects in the baby. °· Eating four or five small meals rather than three large meals a day may help relieve nausea and vomiting. If you start to feel nauseous, eating a few soda crackers can be helpful. Drinking liquids between meals instead of during meals also seems to help nausea and vomiting. °· If you develop constipation, eat more high-fiber foods, such as fresh vegetables or fruit and whole grains. Drink enough fluids to keep your urine clear or pale yellow. °Activity and Exercise °· Exercise only as directed by your health care provider. Exercising will help you: °¨ Control your weight. °¨ Stay in shape. °¨ Be prepared for labor and delivery. °· Experiencing pain or cramping in the lower abdomen or low back is a good sign that you should stop exercising. Check with your health care provider before continuing normal exercises. °· Try to avoid standing for long periods of time. Move your legs often if you must stand in one place for a long time. °· Avoid heavy lifting. °· Wear low-heeled shoes, and practice good posture. °· You may continue to have sex unless your health care provider directs you otherwise. °Relief of Pain or Discomfort °· Wear a good support bra for breast tenderness.   °· Take warm sitz baths to soothe any pain or discomfort caused by hemorrhoids. Use hemorrhoid cream if your health care provider approves.   °· Rest with your legs elevated if you have leg cramps or low back pain. °· If you develop varicose veins in your legs, wear support hose. Elevate your feet for 15 minutes, 3-4 times a day. Limit salt in your diet. °Prenatal Care °· Schedule your prenatal visits by the twelfth week of pregnancy. They are usually scheduled monthly at first, then more often in the last 2 months before  delivery. °· Write down your questions. Take them to your prenatal visits. °· Keep all your prenatal visits as directed by your health care provider. °Safety °· Wear your seat belt at all times when driving. °· Make a list of emergency phone numbers, including numbers for family, friends, the hospital, and police and fire departments. °General Tips °· Ask your health care provider for a referral to a local prenatal education class. Begin classes no later than at the beginning of month 6 of your pregnancy. °· Ask for help if you have counseling or nutritional needs during pregnancy. Your health care provider can offer advice or refer you to specialists for help with various needs. °· Do not use   hot tubs, steam rooms, or saunas. °· Do not douche or use tampons or scented sanitary pads. °· Do not cross your legs for long periods of time. °· Avoid cat litter boxes and soil used by cats. These carry germs that can cause birth defects in the baby and possibly loss of the fetus by miscarriage or stillbirth. °· Avoid all smoking, herbs, alcohol, and medicines not prescribed by your health care provider. Chemicals in these affect the formation and growth of the baby. °· Do not use any tobacco products, including cigarettes, chewing tobacco, and electronic cigarettes. If you need help quitting, ask your health care provider. You may receive counseling support and other resources to help you quit. °· Schedule a dentist appointment. At home, brush your teeth with a soft toothbrush and be gentle when you floss. °SEEK MEDICAL CARE IF:  °· You have dizziness. °· You have mild pelvic cramps, pelvic pressure, or nagging pain in the abdominal area. °· You have persistent nausea, vomiting, or diarrhea. °· You have a bad smelling vaginal discharge. °· You have pain with urination. °· You notice increased swelling in your face, hands, legs, or ankles. °SEEK IMMEDIATE MEDICAL CARE IF:  °· You have a fever. °· You are leaking fluid from  your vagina. °· You have spotting or bleeding from your vagina. °· You have severe abdominal cramping or pain. °· You have rapid weight gain or loss. °· You vomit blood or material that looks like coffee grounds. °· You are exposed to German measles and have never had them. °· You are exposed to fifth disease or chickenpox. °· You develop a severe headache. °· You have shortness of breath. °· You have any kind of trauma, such as from a fall or a car accident. °  °This information is not intended to replace advice given to you by your health care provider. Make sure you discuss any questions you have with your health care provider. °  °Document Released: 08/20/2001 Document Revised: 09/16/2014 Document Reviewed: 07/06/2013 °Elsevier Interactive Patient Education ©2016 [employer] ° °

## 2015-08-20 NOTE — ED Notes (Signed)
She states she has vomited x 2 and denies any diarrhea or abd. Pain.  She is in no distress.

## 2015-09-09 ENCOUNTER — Emergency Department (HOSPITAL_COMMUNITY)
Admission: EM | Admit: 2015-09-09 | Discharge: 2015-09-09 | Disposition: A | Discharge status: Home / Self Care | Payer: Medicaid Other | Attending: Emergency Medicine | Admitting: Emergency Medicine

## 2015-09-09 ENCOUNTER — Encounter (HOSPITAL_COMMUNITY): Payer: Self-pay | Admitting: Emergency Medicine

## 2015-09-09 DIAGNOSIS — J019 Acute sinusitis, unspecified: Secondary | ICD-10-CM | POA: Diagnosis not present

## 2015-09-09 DIAGNOSIS — O99511 Diseases of the respiratory system complicating pregnancy, first trimester: Secondary | ICD-10-CM | POA: Diagnosis not present

## 2015-09-09 DIAGNOSIS — Z3A00 Weeks of gestation of pregnancy not specified: Secondary | ICD-10-CM | POA: Insufficient documentation

## 2015-09-09 DIAGNOSIS — Z79899 Other long term (current) drug therapy: Secondary | ICD-10-CM | POA: Insufficient documentation

## 2015-09-09 DIAGNOSIS — O9989 Other specified diseases and conditions complicating pregnancy, childbirth and the puerperium: Secondary | ICD-10-CM | POA: Diagnosis present

## 2015-09-09 HISTORY — DX: Encounter for supervision of normal pregnancy, unspecified, unspecified trimester: Z34.90

## 2015-09-09 MED ORDER — ACETAMINOPHEN 325 MG PO TABS
650.0000 mg | ORAL_TABLET | Freq: Once | ORAL | Status: AC
  Administered 2015-09-09: 650 mg via ORAL
  Filled 2015-09-09: qty 2

## 2015-09-09 MED ORDER — CETIRIZINE HCL 5 MG PO TABS: 5.0000 mg | ORAL_TABLET | Freq: Every day | ORAL | Status: DC

## 2015-09-09 MED ORDER — CETIRIZINE HCL 5 MG/5ML PO SYRP
5.0000 mg | ORAL_SOLUTION | Freq: Once | ORAL | Status: AC
  Administered 2015-09-09: 5 mg via ORAL
  Filled 2015-09-09: qty 5

## 2015-09-09 NOTE — ED Provider Notes (Signed)
CSN: [id number]     Arrival date & time 09/09/15  1500 History  By signing my name below, I, Tanda Rockers, attest that this documentation has been prepared under the direction and in the presence of Cheri Fowler, PA-C. Electronically Signed: Tanda Rockers, ED Scribe. 09/09/2015. 4:02 PM.   Chief Complaint  Patient presents with  . Otalgia    l/ear pain  . Headache   The history is provided by the patient. No language interpreter was used.     HPI Comments: Peggy Hooper is a 24 y.o. female who presents to the Emergency Department complaining of gradual onset, constant, moderate, sinus pressure x 2 days.  Pt also complains of nasal congestion, left ear pain, sore throat, and dry cough. Pt has been taking Tylenol without relief. Denies ear drainage, tinnitus, myalgias, neck stiffness, nausea, vomiting, slurred speech, facial droop, recent head injury, or any other associated symptoms. She is currently pregnant.   Past Medical History  Diagnosis Date  . Medical history non-contributory   . Pregnant    Past Surgical History  Procedure Laterality Date  . No past surgeries     Family History  Problem Relation Age of Onset  . Diabetes Mother    Social History  Substance Use Topics  . Smoking status: Never Smoker   . Smokeless tobacco: None  . Alcohol Use: No   OB History    Gravida Para Term Preterm AB TAB SAB Ectopic Multiple Living   1              Review of Systems  Constitutional: Negative for fever.  HENT: Positive for congestion, ear pain, sinus pressure and sore throat. Negative for ear discharge and tinnitus.   Respiratory: Positive for cough.   Gastrointestinal: Negative for nausea and vomiting.  Musculoskeletal: Negative for myalgias and neck stiffness.  Neurological: Negative for speech difficulty.  All other systems reviewed and are negative.   Allergies  Review of patient's allergies indicates no known allergies.  Home Medications   Prior to Admission  medications   Medication Sig Start Date End Date Taking? Authorizing Provider  Prenatal Vit-Fe Fumarate-FA (PRENATAL COMPLETE) 14-0.4 MG TABS Take 1 tablet by mouth 2 (two) times daily. 08/20/15  Yes Tiffany Neva Seat, PA-C  cetirizine (ZYRTEC) 5 MG tablet Take 1 tablet (5 mg total) by mouth daily. 09/09/15   Cheri Fowler, PA-C  hydrOXYzine (ATARAX/VISTARIL) 25 MG tablet Take 1 tablet (25 mg total) by mouth every 6 (six) hours as needed for anxiety. Patient not taking: Reported on 08/20/2015 04/27/15   Adonis Brook, NP  traZODone (DESYREL) 50 MG tablet Take 1 tablet (50 mg total) by mouth at bedtime and may repeat dose one time if needed. Patient not taking: Reported on 08/20/2015 04/27/15   Adonis Brook, NP   Triage Vitals: BP 130/67 mmHg  Pulse 92  Temp(Src) 98.5 F (36.9 C) (Oral)  Resp 17  SpO2 100%  LMP 07/15/2015 (Exact Date)   Physical Exam  Constitutional: She is oriented to person, place, and time. She appears well-developed and well-nourished. No distress.  HENT:  Head: Normocephalic and atraumatic.  Right Ear: Hearing, tympanic membrane, external ear and ear canal normal. Tympanic membrane is not erythematous, not retracted and not bulging. No middle ear effusion.  Left Ear: Hearing, tympanic membrane, external ear and ear canal normal. Tympanic membrane is not erythematous, not retracted and not bulging.  No middle ear effusion.  Nose: Nose normal. Right sinus exhibits no maxillary sinus tenderness and no frontal  sinus tenderness. Left sinus exhibits no maxillary sinus tenderness and no frontal sinus tenderness.  Mouth/Throat: Uvula is midline, oropharynx is clear and moist and mucous membranes are normal. No trismus in the jaw. No oropharyngeal exudate, posterior oropharyngeal edema, posterior oropharyngeal erythema or tonsillar abscesses.  Eyes: Conjunctivae and EOM are normal.  No lid swelling or erythema.   Neck: Normal range of motion. Neck supple. No tracheal deviation  present.  No nuchal rigidity.  Cardiovascular: Normal rate, regular rhythm and normal heart sounds.   Pulmonary/Chest: Effort normal and breath sounds normal. No respiratory distress.  Abdominal: She exhibits no distension.  Musculoskeletal: Normal range of motion.  Lymphadenopathy:    She has no cervical adenopathy.  Neurological: She is alert and oriented to person, place, and time.  Skin: Skin is warm and dry.  Psychiatric: She has a normal mood and affect. Her behavior is normal.  Nursing note and vitals reviewed.   ED Course  Procedures (including critical care time)  DIAGNOSTIC STUDIES: Oxygen Saturation is 100% on RA, normal by my interpretation.    COORDINATION OF CARE: 4:01 PM-Discussed treatment plan which includes Zyrtec with pt at bedside and pt agreed to plan.   Labs Review Labs Reviewed - No data to display  Imaging Review No results found.    EKG Interpretation None      MDM   Final diagnoses:  Acute sinusitis, recurrence not specified, unspecified location   Patient presents with 2 day history of sinus pressure, nasal congestion, left ear pain, sore throat, and dry cough.  No fever, myalgias, CP, or SOB.  VSS, NAD.  On exam, TMs clear bilaterally, uvula midline, oropharynx clear and benign.  Heart RRR, lungs CTAB, abdomen soft and benign.  No nuchal rigidity.  Doubt meningitis.  Doubt AOM.  Doubt PNA.  Doubt orbital cellulitis.  Suspect sinusitis.  Abx not indicated at this time, likely viral etiology.  Will give zyrtec.  Patient advised to continue taking tylenol for pain.  Follow up PCP in 3 days.  Evaluation does not show pathology requiring ongoing emergent intervention or admission. Pt is hemodynamically stable and mentating appropriately. Discussed findings/results and plan with patient/guardian, who agrees with plan. All questions answered. Return precautions discussed and outpatient follow up given.    I personally performed the services described in  this documentation, which was scribed in my presence. The recorded information has been reviewed and is accurate.      Cheri FowlerKayla Marquese Burkland, PA-C 09/09/15 1615  Laurence Spatesachel Morgan Little, MD 09/14/15 [phone]

## 2015-09-09 NOTE — ED Notes (Signed)
Pt reports headache, sinus pressure, sore throat and left ear pain increasing over the last 2 days. Treated with tylenol, no relief of symptoms. No wheezing noted

## 2015-09-09 NOTE — Discharge Instructions (Signed)

## 2015-09-10 NOTE — L&D Delivery Note (Signed)
Delivery Note After very brief 2nd stage, at 2:51 AM a viable female was delivered via  (Presentation: LOA  ).  APGAR: 5, 8; weight: pending at time of note .  Loose nuchal x 1 easily reduced just prior to birth. Infant placed directly on mom's abdomen for bonding/skin-to-skin. Initially w/ poor tone/color/resp effort, good response w/ drying/stimulation/bulb suction. Delayed cord clamping, then cord clamped x 2, and cut by fob.   Placenta status: spontaneously, intact w/ scattered calcifications.  Cord: 3VC, with the following complications: .  Cord pH: not done  Anesthesia:  epidural Episiotomy:  none Lacerations:  Hemostatic supraurethral not requiring repair Suture Repair: n/a Est. Blood Loss (mL):  50ml  Mom to postpartum.  Baby to Couplet care / Skin to Skin. Pump and bottlefeed NPO for BTL in AM  Marge DuncansBooker, Bruno Leach Randall 04/28/2016, 3:14 AM

## 2015-09-18 LAB — OB RESULTS CONSOLE HIV ANTIBODY (ROUTINE TESTING): HIV: NONREACTIVE

## 2015-09-18 LAB — OB RESULTS CONSOLE ABO/RH: RH TYPE: POSITIVE

## 2015-09-18 LAB — OB RESULTS CONSOLE GC/CHLAMYDIA
CHLAMYDIA, DNA PROBE: NEGATIVE
Gonorrhea: NEGATIVE

## 2015-09-18 LAB — OB RESULTS CONSOLE ANTIBODY SCREEN: ANTIBODY SCREEN: NEGATIVE

## 2015-09-18 LAB — OB RESULTS CONSOLE RUBELLA ANTIBODY, IGM: Rubella: IMMUNE

## 2015-09-18 LAB — OB RESULTS CONSOLE HEPATITIS B SURFACE ANTIGEN: Hepatitis B Surface Ag: NEGATIVE

## 2015-09-18 LAB — OB RESULTS CONSOLE RPR: RPR: NONREACTIVE

## 2015-10-10 ENCOUNTER — Ambulatory Visit (HOSPITAL_COMMUNITY)
Admission: RE | Admit: 2015-10-10 | Discharge: 2015-10-10 | Disposition: A | Discharge status: Home / Self Care | Payer: Medicaid Other | Source: Ambulatory Visit | Attending: Nurse Practitioner | Admitting: Nurse Practitioner

## 2015-10-10 ENCOUNTER — Encounter (HOSPITAL_COMMUNITY): Payer: Self-pay

## 2015-10-10 ENCOUNTER — Other Ambulatory Visit (HOSPITAL_COMMUNITY): Payer: Self-pay | Admitting: Nurse Practitioner

## 2015-10-10 VITALS — BP 120/63 | HR 70 | Wt 189.6 lb

## 2015-10-10 DIAGNOSIS — Z3A13 13 weeks gestation of pregnancy: Secondary | ICD-10-CM

## 2015-10-10 DIAGNOSIS — O352XX1 Maternal care for (suspected) hereditary disease in fetus, fetus 1: Secondary | ICD-10-CM

## 2015-10-10 DIAGNOSIS — Z3682 Encounter for antenatal screening for nuchal translucency: Secondary | ICD-10-CM

## 2015-10-10 DIAGNOSIS — Z36 Encounter for antenatal screening of mother: Secondary | ICD-10-CM | POA: Diagnosis not present

## 2015-10-10 DIAGNOSIS — Z3A12 12 weeks gestation of pregnancy: Secondary | ICD-10-CM | POA: Diagnosis not present

## 2015-10-10 DIAGNOSIS — Z3689 Encounter for other specified antenatal screening: Secondary | ICD-10-CM

## 2015-10-10 NOTE — Progress Notes (Signed)
Genetic Counseling  High-Risk Gestation Note  Appointment Date:  10/10/2015 Referred By: Jolaine Click, NP Date of Birth:  11/06/1990 Partner: Dorene Ar   Pregnancy History: G1P0 Estimated Date of Delivery: 04/16/16 Estimated Gestational Age: 25w0dAttending: DRenella Cunas MD  I met with EGari Crownfor genetic counseling regarding the father of the baby's age of 561   In summary:  Reviewed APA and the associated risks for  Single gene conditions  Possible aneuploidy  Autism spectrum disorders  Reviewed the availability of screening and diagnostic testing  First trimester screening could not be performed today-NT measurement was not obtainable  Patient elected to return for a detailed anatomy ultrasound  She will decide if she wants additional testing following her ultrasound  Ms. Varnell was counseled that advanced paternal age (APA) is defined as paternal age greater than or equal to age 25  Recent large-scale sequencing studies have shown that approximately 80% of de novo point mutations are of paternal origin.  Many studies have demonstrated a strong correlation between increased paternal age and de novo point mutations.  Although no specific data is available regarding fetal risks for fathers 416 years old at conception, it is apparent that the overall risk for single gene conditions is increased.  To estimate the relative increase in risk of a genetic disorder with APA, the heritability of the disease must be considered.  Assuming an approximate 2x increase in risk for conditions that are exclusively paternal in origin, the risk for each individual condition is still relatively low.  It is estimated that the overall chance for a de novo mutation is ~0.5%.  We also discussed the wide range of conditions which can be caused by new dominant gene mutations (achondroplasia, neurofibromatosis, Marfan syndrome etc.).  She was counseled that genetic testing for each individual  single gene condition is not warranted or available unless ultrasound or family history concerns lend suspicion to a specific condition.    In addition, she was counseled that some literature suggests that APA is also associated with an increase in risk for fetal aneuploidy.  While other literature does not support this, we discussed that a specific risk for aneuploidy other than that based on maternal age cannot be quantified.  For this reason, we discussed available screening and diagnostic options for fetal aneuploidy.  Specifically, we discussed the options of nuchal translucency ultrasound, First Screen, Quad screening, and noninvasive prenatal screening (NIPS).  Immediately prior to this appointment, a nuchal translucency ultrasound was attempted. Due to fetal positioning, an accurate NT measurement could not be obtained. For this reason, First trimester screening is not possible. We discussed that NIPS is newer testing, which utilizes cell free fetal DNA found in the maternal circulation. This test is not diagnostic for chromosome conditions, but can provide information regarding the presence or absence of extra fetal DNA for chromosomes 13, 18 and 21. Thus, it would not identify or rule out all genetic conditions. We also discussed the option of maternal serum Quad screening and detailed ultrasound. We then reviewed diagnostic testing options including CVS and amniocentesis.  The risks, benefits, and limitations of all of these options were reviewed in detail.  Ms. WMerschwould like to return for a detailed anatomy ultrasound and will consider the option of noninvasive screening at that time (either Quad or NIPS).   Lastly, we discussed that newer literature suggests that the risk for autism spectrum disorders (ASD) may be increased in children born to fathers of APA.  We  discussed that ASDs are among the most common neurodevelopmental disorders, with approximately 1 in 12 children meeting criteria for  ASD.  Approximately 80% of individuals diagnosed are female.  There is strong evidence that genetic factors play a critical role in development of ASD.  While there have been recent advances in identifying specific genetic causes of ASD, there are still many individuals for whom the etiology of the ASD is not known.  She understands that at this time there is no reliable, comprehensive genetic testing available for ASD.     Ms. Buel was provided with written information regarding sickle cell anemia (SCA) including the carrier frequency and incidence in the African-American population, the availability of carrier testing and prenatal diagnosis if indicated.  In addition, we discussed that hemoglobinopathies are routinely screened for as part of the [facility] newborn screening panel.  She declined hemoglobin electrophoresis today.  Both family histories were reviewed and found to be noncontributory for birth defects, intellectual disability, and known genetic conditions. Without further information regarding the provided family history, an accurate genetic risk cannot be calculated. Further genetic counseling is warranted if more information is obtained.  Ms. Gainer denied exposure to environmental toxins or chemical agents. She denied the use of alcohol, tobacco or street drugs. She denied significant viral illnesses during the course of her pregnancy. Her medical and surgical histories were noncontributory.   I counseled Ms. Bagnell regarding the above risks and available options.  The approximate face-to-face time with the genetic counselor was 34 minutes.  Filbert Schilder, MS Certified Genetic Counselor

## 2015-11-21 ENCOUNTER — Ambulatory Visit (HOSPITAL_COMMUNITY): Admission: RE | Admit: 2015-11-21 | Payer: Medicaid Other | Source: Ambulatory Visit

## 2015-11-23 ENCOUNTER — Ambulatory Visit (HOSPITAL_COMMUNITY)
Admission: RE | Admit: 2015-11-23 | Discharge: 2015-11-23 | Disposition: A | Discharge status: Home / Self Care | Payer: Medicaid Other | Source: Ambulatory Visit | Attending: Maternal and Fetal Medicine | Admitting: Maternal and Fetal Medicine

## 2015-11-23 ENCOUNTER — Other Ambulatory Visit (HOSPITAL_COMMUNITY): Payer: Self-pay | Admitting: Maternal and Fetal Medicine

## 2015-11-23 ENCOUNTER — Encounter (HOSPITAL_COMMUNITY): Payer: Self-pay

## 2015-11-23 DIAGNOSIS — Z3A18 18 weeks gestation of pregnancy: Secondary | ICD-10-CM | POA: Diagnosis not present

## 2015-11-23 DIAGNOSIS — Z36 Encounter for antenatal screening of mother: Secondary | ICD-10-CM | POA: Diagnosis not present

## 2015-11-23 DIAGNOSIS — O0990 Supervision of high risk pregnancy, unspecified, unspecified trimester: Secondary | ICD-10-CM

## 2015-11-23 DIAGNOSIS — R109 Unspecified abdominal pain: Secondary | ICD-10-CM

## 2015-11-23 DIAGNOSIS — O26899 Other specified pregnancy related conditions, unspecified trimester: Secondary | ICD-10-CM

## 2015-11-23 DIAGNOSIS — Z3689 Encounter for other specified antenatal screening: Secondary | ICD-10-CM

## 2015-12-01 ENCOUNTER — Other Ambulatory Visit (HOSPITAL_COMMUNITY): Payer: Self-pay

## 2016-03-25 ENCOUNTER — Encounter (HOSPITAL_COMMUNITY): Payer: Self-pay | Admitting: Obstetrics & Gynecology

## 2016-03-25 LAB — OB RESULTS CONSOLE GC/CHLAMYDIA
Chlamydia: NEGATIVE
Gonorrhea: NEGATIVE

## 2016-03-25 LAB — OB RESULTS CONSOLE GBS: GBS: POSITIVE

## 2016-04-23 ENCOUNTER — Encounter (HOSPITAL_COMMUNITY): Payer: Self-pay | Admitting: *Deleted

## 2016-04-23 ENCOUNTER — Telehealth (HOSPITAL_COMMUNITY): Payer: Self-pay | Admitting: *Deleted

## 2016-04-23 NOTE — Telephone Encounter (Signed)
Preadmission screen  

## 2016-04-27 ENCOUNTER — Inpatient Hospital Stay (HOSPITAL_COMMUNITY)
Admission: RE | Admit: 2016-04-27 | Discharge: 2016-04-29 | DRG: 767 | Disposition: A | Discharge status: Home / Self Care | Payer: Medicaid Other | Source: Ambulatory Visit | Attending: Obstetrics and Gynecology | Admitting: Obstetrics and Gynecology

## 2016-04-27 ENCOUNTER — Encounter (HOSPITAL_COMMUNITY): Payer: Self-pay

## 2016-04-27 ENCOUNTER — Inpatient Hospital Stay (HOSPITAL_COMMUNITY): Payer: Medicaid Other | Admitting: Anesthesiology

## 2016-04-27 VITALS — BP 135/76 | HR 67 | Temp 97.9°F | Resp 16 | Ht 66.0 in | Wt 236.0 lb

## 2016-04-27 DIAGNOSIS — O480 Post-term pregnancy: Principal | ICD-10-CM

## 2016-04-27 DIAGNOSIS — Z3A41 41 weeks gestation of pregnancy: Secondary | ICD-10-CM | POA: Diagnosis not present

## 2016-04-27 DIAGNOSIS — O133 Gestational [pregnancy-induced] hypertension without significant proteinuria, third trimester: Secondary | ICD-10-CM

## 2016-04-27 DIAGNOSIS — Z302 Encounter for sterilization: Secondary | ICD-10-CM

## 2016-04-27 DIAGNOSIS — O99824 Streptococcus B carrier state complicating childbirth: Secondary | ICD-10-CM | POA: Diagnosis present

## 2016-04-27 LAB — COMPREHENSIVE METABOLIC PANEL
ALBUMIN: 3.1 g/dL — AB (ref 3.5–5.0)
ALK PHOS: 164 U/L — AB (ref 38–126)
ALT: 9 U/L — ABNORMAL LOW (ref 14–54)
ANION GAP: 9 (ref 5–15)
AST: 17 U/L (ref 15–41)
BILIRUBIN TOTAL: 0.3 mg/dL (ref 0.3–1.2)
BUN: 12 mg/dL (ref 6–20)
CALCIUM: 9.1 mg/dL (ref 8.9–10.3)
CO2: 20 mmol/L — ABNORMAL LOW (ref 22–32)
Chloride: 107 mmol/L (ref 101–111)
Creatinine, Ser: 0.66 mg/dL (ref 0.44–1.00)
GFR calc Af Amer: >60 mL/min (ref >60)
GLUCOSE: 93 mg/dL (ref 65–99)
Potassium: 3.9 mmol/L (ref 3.5–5.1)
Sodium: 136 mmol/L (ref 135–145)
TOTAL PROTEIN: 6.2 g/dL — AB (ref 6.5–8.1)

## 2016-04-27 LAB — RPR: RPR: NONREACTIVE

## 2016-04-27 LAB — CBC
HCT: 26.5 % — ABNORMAL LOW (ref 36.0–46.0)
Hemoglobin: 8.9 g/dL — ABNORMAL LOW (ref 12.0–15.0)
MCH: 28.8 pg (ref 26.0–34.0)
MCHC: 33.6 g/dL (ref 30.0–36.0)
MCV: 85.8 fL (ref 78.0–100.0)
PLATELETS: 185 10*3/uL (ref 150–400)
RBC: 3.09 MIL/uL — ABNORMAL LOW (ref 3.87–5.11)
RDW: 14.6 % (ref 11.5–15.5)
WBC: 5.6 10*3/uL (ref 4.0–10.5)

## 2016-04-27 LAB — PROTEIN / CREATININE RATIO, URINE
CREATININE, URINE: 34 mg/dL
PROTEIN CREATININE RATIO: 0.24 mg/mg{creat} — AB (ref 0.00–0.15)
Total Protein, Urine: 8 mg/dL

## 2016-04-27 LAB — TYPE AND SCREEN
ABO/RH(D): A POS
Antibody Screen: NEGATIVE

## 2016-04-27 LAB — ABO/RH: ABO/RH(D): A POS

## 2016-04-27 MED ORDER — MISOPROSTOL 25 MCG QUARTER TABLET
25.0000 ug | ORAL_TABLET | ORAL | Status: DC | PRN
  Administered 2016-04-27: 25 ug via VAGINAL
  Filled 2016-04-27: qty 1
  Filled 2016-04-27: qty 0.25

## 2016-04-27 MED ORDER — PENICILLIN G POTASSIUM 5000000 UNITS IJ SOLR
2.5000 10*6.[IU] | INTRAVENOUS | Status: DC
  Administered 2016-04-27 – 2016-04-28 (×5): 2.5 10*6.[IU] via INTRAVENOUS
  Filled 2016-04-27 (×11): qty 2.5

## 2016-04-27 MED ORDER — OXYCODONE-ACETAMINOPHEN 5-325 MG PO TABS: 1.0000 | ORAL_TABLET | ORAL | Status: DC | PRN

## 2016-04-27 MED ORDER — LIDOCAINE HCL (PF) 1 % IJ SOLN
INTRAMUSCULAR | Status: DC | PRN
  Administered 2016-04-27: 4 mL via EPIDURAL
  Administered 2016-04-27: 2 mL via EPIDURAL
  Administered 2016-04-27: 4 mL via EPIDURAL
  Administered 2016-04-28 (×2): 5 mL
  Administered 2016-04-28: 10 mL

## 2016-04-27 MED ORDER — EPHEDRINE 5 MG/ML INJ
10.0000 mg | INTRAVENOUS | Status: DC | PRN
  Filled 2016-04-27: qty 4

## 2016-04-27 MED ORDER — LIDOCAINE HCL (PF) 1 % IJ SOLN
30.0000 mL | INTRAMUSCULAR | Status: DC | PRN
  Filled 2016-04-27: qty 30

## 2016-04-27 MED ORDER — ACETAMINOPHEN 325 MG PO TABS: 650.0000 mg | ORAL_TABLET | ORAL | Status: DC | PRN

## 2016-04-27 MED ORDER — PHENYLEPHRINE 40 MCG/ML (10ML) SYRINGE FOR IV PUSH (FOR BLOOD PRESSURE SUPPORT)
80.0000 ug | PREFILLED_SYRINGE | INTRAVENOUS | Status: DC | PRN
  Filled 2016-04-27: qty 5
  Filled 2016-04-27: qty 10

## 2016-04-27 MED ORDER — OXYTOCIN BOLUS FROM INFUSION
500.0000 mL | Freq: Once | INTRAVENOUS | Status: AC
  Administered 2016-04-28: 500 mL via INTRAVENOUS

## 2016-04-27 MED ORDER — OXYTOCIN 40 UNITS IN LACTATED RINGERS INFUSION - SIMPLE MED
2.5000 [IU]/h | INTRAVENOUS | Status: DC
  Administered 2016-04-28: 2.5 [IU]/h via INTRAVENOUS

## 2016-04-27 MED ORDER — DEXTROSE 5 % IV SOLN
5.0000 10*6.[IU] | Freq: Once | INTRAVENOUS | Status: AC
  Administered 2016-04-27: 5 10*6.[IU] via INTRAVENOUS
  Filled 2016-04-27: qty 5

## 2016-04-27 MED ORDER — FENTANYL 2.5 MCG/ML BUPIVACAINE 1/10 % EPIDURAL INFUSION (WH - ANES)
14.0000 mL/h | INTRAMUSCULAR | Status: DC | PRN
  Administered 2016-04-27 (×2): 14 mL/h via EPIDURAL
  Filled 2016-04-27 (×2): qty 125

## 2016-04-27 MED ORDER — PHENYLEPHRINE 40 MCG/ML (10ML) SYRINGE FOR IV PUSH (FOR BLOOD PRESSURE SUPPORT)
80.0000 ug | PREFILLED_SYRINGE | INTRAVENOUS | Status: DC | PRN
  Filled 2016-04-27: qty 5

## 2016-04-27 MED ORDER — FENTANYL CITRATE (PF) 100 MCG/2ML IJ SOLN
50.0000 ug | INTRAMUSCULAR | Status: DC | PRN
  Administered 2016-04-27 (×6): 100 ug via INTRAVENOUS
  Filled 2016-04-27 (×6): qty 2

## 2016-04-27 MED ORDER — TERBUTALINE SULFATE 1 MG/ML IJ SOLN
0.2500 mg | Freq: Once | INTRAMUSCULAR | Status: DC | PRN
  Filled 2016-04-27: qty 1

## 2016-04-27 MED ORDER — ONDANSETRON HCL 4 MG/2ML IJ SOLN: 4.0000 mg | Freq: Four times a day (QID) | INTRAMUSCULAR | Status: DC | PRN

## 2016-04-27 MED ORDER — LACTATED RINGERS IV SOLN
500.0000 mL | INTRAVENOUS | Status: DC | PRN
  Administered 2016-04-27 (×3): 500 mL via INTRAVENOUS

## 2016-04-27 MED ORDER — DIPHENHYDRAMINE HCL 50 MG/ML IJ SOLN: 12.5000 mg | INTRAMUSCULAR | Status: DC | PRN

## 2016-04-27 MED ORDER — OXYCODONE-ACETAMINOPHEN 5-325 MG PO TABS: 2.0000 | ORAL_TABLET | ORAL | Status: DC | PRN

## 2016-04-27 MED ORDER — OXYTOCIN 40 UNITS IN LACTATED RINGERS INFUSION - SIMPLE MED
1.0000 m[IU]/min | INTRAVENOUS | Status: DC
  Administered 2016-04-27: 2 m[IU]/min via INTRAVENOUS
  Administered 2016-04-27: 6 m[IU]/min via INTRAVENOUS
  Administered 2016-04-27: 4 m[IU]/min via INTRAVENOUS
  Filled 2016-04-27: qty 1000

## 2016-04-27 MED ORDER — SOD CITRATE-CITRIC ACID 500-334 MG/5ML PO SOLN: 30.0000 mL | ORAL | Status: DC | PRN

## 2016-04-27 MED ORDER — LACTATED RINGERS IV SOLN
INTRAVENOUS | Status: DC
  Administered 2016-04-27 (×4): via INTRAVENOUS

## 2016-04-27 NOTE — Progress Notes (Signed)
Patient ID: Peggy Hooper, female   DOB: [birth year], 25 y.o.   MRN: [id number] Peggy Hooper is a 25 y.o. G1P0 at 3422w4d admitted for induction of labor due to Post dates. Due date 04/16/16.  Subjective: Doing well, has gotten iv pain meds which have helped, interested in nitrous, doesn't want epidural. Adamant she wants BTL, consent signed 7/17 and on chart under media  Objective: BP 133/74   Pulse 67   Temp 98.7 F (37.1 C) (Oral)   Resp 18   Ht 5\' 6"  (1.676 m)   Wt 107 kg (236 lb)   LMP 07/11/2015   BMI 38.09 kg/m  Total I/O In: 60 [P.O.:60] Out: -   FHT:  FHR: 125 bpm, variability: moderate,  accelerations:  Present,  decelerations:  Absent UC:   q 2-256mins  SVE:   Dilation: 5.5 Effacement (%): 60 Station: -2 Exam by:: Joellyn HaffKim Glennys Schorsch, CNM   AROM mod amt mod MSF  Pitocin @ 0 mu/min  Labs: Lab Results  Component Value Date   WBC 5.6 04/27/2016   HGB 8.9 (L) 04/27/2016   HCT 26.5 (L) 04/27/2016   MCV 85.8 04/27/2016   PLT 185 04/27/2016    Assessment / Plan: IOL d/t postdates, s/p foley bulb, now arom'd   Labor: Progressing normally Fetal Wellbeing:  Category I Pain Control:  IV pain meds, nitrous if decides, understands can't have both together Pre-eclampsia: n/a I/D:  pcn for gbs+ Anticipated MOD:  NSVD  Marge DuncansBooker, Edgardo Petrenko Randall CNM, WHNP-BC 04/27/2016, 10:45 AM

## 2016-04-27 NOTE — H&P (Signed)
LABOR ADMISSION HISTORY AND PHYSICAL  Peggy Hooper is a 25 y.o. female G1P0 with IUP at 6157w4d by  U/S presenting for postdates  She reports +FM, + contractions, No LOF, no VB, no blurry vision, headaches or peripheral edema, and RUQ pain.  She plans on breast  feeding. She request BTL for birth control.  Dating: By U/S --->  Estimated Date of Delivery: 04/16/16  Sono:    @18w , CWD, normal anatomy, cephalic presentation,  261 g, 48 % EFW  Prenatal History/Complications:  Past Medical History: Past Medical History:  Diagnosis Date  . Medical history non-contributory   . Pregnant     Past Surgical History: Past Surgical History:  Procedure Laterality Date  . NO PAST SURGERIES      Obstetrical History: OB History    Gravida Para Term Preterm AB Living   1             SAB TAB Ectopic Multiple Live Births                  Social History: Social History   Social History  . Marital status: Single    Spouse name: N/A  . Number of children: N/A  . Years of education: N/A   Social History Main Topics  . Smoking status: Never Smoker  . Smokeless tobacco: Not on file  . Alcohol use No  . Drug use: No  . Sexual activity: Yes    Birth control/ protection: None, Other-see comments   Other Topics Concern  . Not on file   Social History Narrative  . No narrative on file    Family History: Family History  Problem Relation Age of Onset  . Diabetes Mother     Allergies: No Known Allergies  Prescriptions Prior to Admission  Medication Sig Dispense Refill Last Dose  . cetirizine (ZYRTEC) 5 MG tablet Take 1 tablet (5 mg total) by mouth daily. (Patient not taking: Reported on 10/10/2015) 30 tablet 0 Not Taking  . hydrOXYzine (ATARAX/VISTARIL) 25 MG tablet Take 1 tablet (25 mg total) by mouth every 6 (six) hours as needed for anxiety. (Patient not taking: Reported on 08/20/2015) 30 tablet 0 Not Taking  . Prenatal Vit-Fe Fumarate-FA (PRENATAL COMPLETE) 14-0.4 MG TABS Take 1  tablet by mouth 2 (two) times daily. 60 each 2 Taking  . traZODone (DESYREL) 50 MG tablet Take 1 tablet (50 mg total) by mouth at bedtime and may repeat dose one time if needed. (Patient not taking: Reported on 08/20/2015) 30 tablet 0 Not Taking     Review of Systems   All systems reviewed and negative except as stated in HPI  LMP 07/11/2015  General appearance: alert Lungs: clear to auscultation bilaterally Heart: regular rate and rhythm Abdomen: soft, non-tender; bowel sounds normal Extremities: Homans sign is negative, no sign of DVT, edema Presentation: cephalic Fetal monitoringBaseline: 130 bpm, Variability: Good {> 6 bpm), Accelerations: Reactive and Decelerations: Absent Uterine activityFrequency: Irregular, minimal severity    Prenatal labs: ABO, Rh: A/Positive/-- (01/09 0000) Antibody: Negative (01/09 0000) Rubella: !Error! Immune  RPR: Nonreactive (01/09 0000)  HBsAg: Negative (01/09 0000)  HIV: Non-reactive (01/09 0000)  GBS: Positive (07/17 0000)  1 hr Glucola 98 Genetic screening: Negative  Anatomy US Normal   Prenatal Transfer Tool  Maternal Diabetes: No Genetic Screening: Normal Maternal Ultrasounds/Referrals: Normal Fetal Ultrasounds or other Referrals:  None Maternal Substance Abuse:  No Significant Maternal Medications:  None Significant Maternal Lab Results: Lab values include: Group B Strep positive  No results found for this or any previous visit (from the past 24 hour(s)).  Patient Active Problem List   Diagnosis Date Noted  . MDD (major depressive disorder), recurrent episode, severe (HCC) 04/25/2015  . Suicide attempt by drug ingestion (HCC) 04/25/2015  . Normocytic anemia 04/25/2015  . Hypokalemia 04/25/2015    Assessment: Peggy Hooper is a 25 y.o. G1P0 at 2755w4d here for IOL due to postdates  #Labor: Cytotec + Foley  #Pain: Epidural vs Fentanyl  #FWB: Category 1 #ID: GBS positive   #MOF: Breast  #MOC:BTL  #Circ:  N/A  Hx of Major  depression and suicidal ideation prior to pregnancy - social work consult   @SIGN @

## 2016-04-27 NOTE — Progress Notes (Signed)
CTSP d/t prolong deceleration into the 90's. Pitocin off, position changed with resolution. SVE 8/80%/-1 Will allow to recover and then restart pitocin at 4 milliunit in 30 minutes Continue with expectant management.

## 2016-04-27 NOTE — Progress Notes (Signed)
Waiting in admit and induction orders

## 2016-04-27 NOTE — Progress Notes (Signed)
Patient ID: Merri RayEurica Toso, female   DOB: [birth year], 25 y.o.   MRN: [id number] LABOR PROGRESS NOTE  Merri Rayurica Vick is a 25 y.o. G1P0 at 59109w4d  admitted for IOL for postdates  Subjective: Patient sleeping comfortably, no complaints  Objective: BP 140/84   Pulse 71   Temp 98.2 F (36.8 C) (Axillary)   Resp 18   Ht 5\' 6"  (1.676 m)   Wt 107 kg (236 lb)   LMP 07/11/2015   SpO2 97%   BMI 38.09 kg/m  or  Vitals:   04/27/16 1658 04/27/16 1700 04/27/16 1730 04/27/16 1800  BP: (!) 148/88 (!) 148/86 137/73 140/84  Pulse: 70 70 67 71  Resp: 18 18 18    Temp:      TempSrc:      SpO2:      Weight:      Height:        FHT: 120 baseline, moderate variability, +accelerations, no decelerations Uterine activity on toco: q1-653min Dilation: 5.5 Effacement (%): 90 Cervical Position: Posterior Station: 0 Presentation: Vertex Exam by:: Kim Booker,CNM   Labs: Lab Results  Component Value Date   WBC 5.6 04/27/2016   HGB 8.9 (L) 04/27/2016   HCT 26.5 (L) 04/27/2016   MCV 85.8 04/27/2016   PLT 185 04/27/2016    Patient Active Problem List   Diagnosis Date Noted  . Post-dates pregnancy 04/27/2016  . MDD (major depressive disorder), recurrent episode, severe (HCC) 04/25/2015  . Suicide attempt by drug ingestion (HCC) 04/25/2015  . Normocytic anemia 04/25/2015  . Hypokalemia 04/25/2015    Assessment / Plan: 25 y.o. G1P0 at 57109w4d here for IOL for postdates   Labor: continue pitocin Fetal Wellbeing:  Category I Pain Control:  fentanyl Anticipated MOD:  SVD  Leland HerElsia J Stamatia Masri, DO PGY-1 8/19/20176:13 PM

## 2016-04-27 NOTE — Anesthesia Preprocedure Evaluation (Signed)
Anesthesia Evaluation  Patient identified by MRN, date of birth, ID band Patient awake    Reviewed: Allergy & Precautions, H&P , NPO status , Patient's Chart, lab work & pertinent test results  Airway Mallampati: II  TM Distance: >3 FB Neck ROM: full    Dental no notable dental hx.    Pulmonary former smoker,    Pulmonary exam normal breath sounds clear to auscultation       Cardiovascular negative cardio ROS Normal cardiovascular exam Rhythm:regular Rate:Normal     Neuro/Psych Depression negative neurological ROS     GI/Hepatic negative GI ROS, Neg liver ROS,   Endo/Other  negative endocrine ROS  Renal/GU negative Renal ROS  negative genitourinary   Musculoskeletal   Abdominal   Peds  Hematology  (+) anemia ,   Anesthesia Other Findings Pregnancy - IOL post dates, anemia Platelets and allergies reviewed Denies active cardiac or pulmonary symptoms, METS > 4  Denies blood thinning medications, bleeding disorders, asthma, supine hypotension syndrome, previous anesthesia difficulties   Reproductive/Obstetrics (+) Pregnancy                             Anesthesia Physical Anesthesia Plan  ASA: III  Anesthesia Plan: Epidural   Post-op Pain Management:    Induction:   Airway Management Planned:   Additional Equipment:   Intra-op Plan:   Post-operative Plan:   Informed Consent: I have reviewed the patients History and Physical, chart, labs and discussed the procedure including the risks, benefits and alternatives for the proposed anesthesia with the patient or authorized representative who has indicated his/her understanding and acceptance.     Plan Discussed with:   Anesthesia Plan Comments:         Anesthesia Quick Evaluation

## 2016-04-27 NOTE — Anesthesia Procedure Notes (Signed)
Epidural Patient location during procedure: OB  Staffing Anesthesiologist: Orville Widmann Performed: anesthesiologist   Preanesthetic Checklist Completed: patient identified, site marked, surgical consent, pre-op evaluation, timeout performed, IV checked, risks and benefits discussed and monitors and equipment checked  Epidural Patient position: sitting Prep: site prepped and draped and DuraPrep Patient monitoring: continuous pulse ox and blood pressure Approach: midline Injection technique: LOR saline  Needle:  Needle type: Tuohy  Needle gauge: 17 G Needle length: 9 cm and 9 Needle insertion depth: 6 cm Catheter type: closed end flexible Catheter size: 19 Gauge Catheter at skin depth: 11 cm Test dose: negative  Assessment Events: blood not aspirated, injection not painful, no injection resistance, negative IV test and no paresthesia  Additional Notes Patient identified. Risks/Benefits/Options discussed with patient including but not limited to bleeding, infection, nerve damage, paralysis, failed block, incomplete pain control, headache, blood pressure changes, nausea, vomiting, reactions to medications, itching and postpartum back pain. Confirmed with bedside nurse the patient's most recent platelet count. Confirmed with patient that they are not currently taking any anticoagulation, have any bleeding history or any family history of bleeding disorders. Patient expressed understanding and wished to proceed. All questions were answered. Sterile technique was used throughout the entire procedure. Please see nursing notes for vital signs. Test dose was given through epidural catheter and negative prior to continuing to dose epidural or start infusion. Warning signs of high block given to the patient including shortness of breath, tingling/numbness in hands, complete motor block, or any concerning symptoms with instructions to call for help. Patient was given instructions on fall risk and  not to get out of bed. All questions and concerns addressed with instructions to call with any issues or inadequate analgesia.  Reason for block:procedure for pain

## 2016-04-27 NOTE — Anesthesia Pain Management Evaluation Note (Signed)
  CRNA Pain Management Visit Note  Patient: Peggy Hooper, 25 y.o., female  "Hello I am a [facility] of the anesthesia team at [facility]. We have an anesthesia team available at all times to provide care throughout the hospital, including epidural management and anesthesia for C-section. I don't know your plan for the delivery whether it a natural birth, water birth, IV sedation, nitrous supplementation, doula or epidural, but we want to meet your pain goals."   1.Was your pain managed to your expectations on prior hospitalizations?   No prior hospitalizations  2.What is your expectation for pain management during this hospitalization?     Labor support without medications  3.How can we help you reach that goal? Be available if needed  Record the patient's initial score and the patient's pain goal.   Pain: 8  Pain Goal: 8 [facility] wants you to be able to say your pain was always managed very well.  Velvie Thomaston 04/27/2016

## 2016-04-27 NOTE — Progress Notes (Addendum)
Patient ID: Peggy Hooper, female   DOB: 28-Oct-1990, 25 y.o.   MRN: [id number] Peggy Hooper is a 25 y.o. G1P0 at 7165w4d admitted for induction of labor due to Post dates. Due date 04/16/16.  Subjective: Comfortable, no complaints  Objective: BP (!) 147/75   Pulse 82   Temp 98.8 F (37.1 C) (Oral)   Resp 18   Ht 5\' 6"  (1.676 m)   Wt 107 kg (236 lb)   LMP 07/11/2015   SpO2 97%   BMI 38.09 kg/m  No intake/output data recorded.  FHT:  FHR: 130 bpm, variability: moderate,  accelerations:  Present,  decelerations:  Absent UC:   q 2-275mins  SVE:   Dilation: 8 Effacement (%): 90 Station: 0 Exam by:: kbooker, cnm   MVUs w/ intermittent periods of adequacy  Pitocin @ 6 mu/min  Labs: Lab Results  Component Value Date   WBC 5.6 04/27/2016   HGB 8.9 (L) 04/27/2016   HCT 26.5 (L) 04/27/2016   MCV 85.8 04/27/2016   PLT 185 04/27/2016    Assessment / Plan: IOL d/t postdates, pitocin restarted at 2045, now @ 526mu/min- RN increased to 218mu/min while we were in room, continue to increase as needed to acheive adequate mvu's/labor  Labor: progressing normally Fetal Wellbeing:  Category I Pain Control:  Epidural Pre-eclampsia: GHTN, pre-e labs normal Anticipated MOD:  NSVD  Marge DuncansBooker, Angus Amini Randall CNM, WHNP-BC 04/27/2016, 10:38 PM

## 2016-04-28 ENCOUNTER — Encounter (HOSPITAL_COMMUNITY)
Admission: RE | Disposition: A | Discharge status: Home / Self Care | Payer: Self-pay | Source: Ambulatory Visit | Attending: Family Medicine

## 2016-04-28 ENCOUNTER — Encounter (HOSPITAL_COMMUNITY): Payer: Self-pay

## 2016-04-28 DIAGNOSIS — Z302 Encounter for sterilization: Secondary | ICD-10-CM

## 2016-04-28 DIAGNOSIS — Z3A41 41 weeks gestation of pregnancy: Secondary | ICD-10-CM

## 2016-04-28 DIAGNOSIS — O480 Post-term pregnancy: Secondary | ICD-10-CM

## 2016-04-28 DIAGNOSIS — O99824 Streptococcus B carrier state complicating childbirth: Secondary | ICD-10-CM

## 2016-04-28 HISTORY — PX: TUBAL LIGATION: SHX77

## 2016-04-28 LAB — CBC
HCT: 24.1 % — ABNORMAL LOW (ref 36.0–46.0)
Hemoglobin: 8.3 g/dL — ABNORMAL LOW (ref 12.0–15.0)
MCH: 29.5 pg (ref 26.0–34.0)
MCHC: 34.4 g/dL (ref 30.0–36.0)
MCV: 85.8 fL (ref 78.0–100.0)
PLATELETS: 154 10*3/uL (ref 150–400)
RBC: 2.81 MIL/uL — AB (ref 3.87–5.11)
RDW: 14.7 % (ref 11.5–15.5)
WBC: 9.9 10*3/uL (ref 4.0–10.5)

## 2016-04-28 SURGERY — LIGATION, FALLOPIAN TUBE, BILATERAL
Anesthesia: Epidural | Site: Abdomen | Laterality: Bilateral

## 2016-04-28 MED ORDER — ZOLPIDEM TARTRATE 5 MG PO TABS: 5.0000 mg | ORAL_TABLET | Freq: Every evening | ORAL | Status: DC | PRN

## 2016-04-28 MED ORDER — FLEET ENEMA 7-19 GM/118ML RE ENEM: 1.0000 | ENEMA | Freq: Every day | RECTAL | Status: DC | PRN

## 2016-04-28 MED ORDER — SENNOSIDES-DOCUSATE SODIUM 8.6-50 MG PO TABS: 2.0000 | ORAL_TABLET | ORAL | Status: DC

## 2016-04-28 MED ORDER — DIPHENHYDRAMINE HCL 25 MG PO CAPS: 25.0000 mg | ORAL_CAPSULE | Freq: Four times a day (QID) | ORAL | Status: DC | PRN

## 2016-04-28 MED ORDER — SUCCINYLCHOLINE CHLORIDE 20 MG/ML IJ SOLN
INTRAMUSCULAR | Status: AC
  Filled 2016-04-28: qty 1

## 2016-04-28 MED ORDER — FAMOTIDINE 20 MG PO TABS
40.0000 mg | ORAL_TABLET | Freq: Once | ORAL | Status: AC
  Administered 2016-04-28: 40 mg via ORAL
  Filled 2016-04-28: qty 2

## 2016-04-28 MED ORDER — LABETALOL HCL 200 MG PO TABS
200.0000 mg | ORAL_TABLET | Freq: Once | ORAL | Status: AC
  Administered 2016-04-28: 200 mg via ORAL
  Filled 2016-04-28: qty 1

## 2016-04-28 MED ORDER — PRENATAL MULTIVITAMIN CH
1.0000 | ORAL_TABLET | Freq: Every day | ORAL | Status: DC
  Administered 2016-04-29: 1 via ORAL
  Filled 2016-04-28: qty 1

## 2016-04-28 MED ORDER — SIMETHICONE 80 MG PO CHEW: 80.0000 mg | CHEWABLE_TABLET | ORAL | Status: DC | PRN

## 2016-04-28 MED ORDER — MEPERIDINE HCL 25 MG/ML IJ SOLN: 6.2500 mg | INTRAMUSCULAR | Status: DC | PRN

## 2016-04-28 MED ORDER — FENTANYL CITRATE (PF) 100 MCG/2ML IJ SOLN: 25.0000 ug | INTRAMUSCULAR | Status: DC | PRN

## 2016-04-28 MED ORDER — MIDAZOLAM HCL 5 MG/5ML IJ SOLN
INTRAMUSCULAR | Status: DC | PRN
  Administered 2016-04-28: 1 mg via INTRAVENOUS

## 2016-04-28 MED ORDER — SODIUM CHLORIDE 0.9% FLUSH: 3.0000 mL | Freq: Two times a day (BID) | INTRAVENOUS | Status: DC

## 2016-04-28 MED ORDER — PROMETHAZINE HCL 25 MG/ML IJ SOLN: 6.2500 mg | INTRAMUSCULAR | Status: DC | PRN

## 2016-04-28 MED ORDER — WITCH HAZEL-GLYCERIN EX PADS: 1.0000 "application " | MEDICATED_PAD | CUTANEOUS | Status: DC | PRN

## 2016-04-28 MED ORDER — BUPIVACAINE HCL (PF) 0.25 % IJ SOLN
INTRAMUSCULAR | Status: DC | PRN
  Administered 2016-04-28: 8 mL via EPIDURAL

## 2016-04-28 MED ORDER — LACTATED RINGERS IV SOLN
INTRAVENOUS | Status: DC | PRN
Start: 2016-04-28 — End: 2016-04-28
  Administered 2016-04-28 (×2): via INTRAVENOUS

## 2016-04-28 MED ORDER — ACETAMINOPHEN 325 MG PO TABS: 650.0000 mg | ORAL_TABLET | ORAL | Status: DC | PRN

## 2016-04-28 MED ORDER — IBUPROFEN 600 MG PO TABS: 600.0000 mg | ORAL_TABLET | Freq: Four times a day (QID) | ORAL | Status: DC

## 2016-04-28 MED ORDER — LACTATED RINGERS IV SOLN
INTRAVENOUS | Status: DC
  Administered 2016-04-28: 20 mL/h via INTRAVENOUS

## 2016-04-28 MED ORDER — ONDANSETRON HCL 4 MG PO TABS: 4.0000 mg | ORAL_TABLET | ORAL | Status: DC | PRN

## 2016-04-28 MED ORDER — BENZOCAINE-MENTHOL 20-0.5 % EX AERO: 1.0000 "application " | INHALATION_SPRAY | CUTANEOUS | Status: DC | PRN

## 2016-04-28 MED ORDER — SODIUM CHLORIDE 0.9% FLUSH: 3.0000 mL | INTRAVENOUS | Status: DC | PRN

## 2016-04-28 MED ORDER — DIBUCAINE 1 % RE OINT: 1.0000 "application " | TOPICAL_OINTMENT | RECTAL | Status: DC | PRN

## 2016-04-28 MED ORDER — MIDAZOLAM HCL 2 MG/2ML IJ SOLN: 0.5000 mg | Freq: Once | INTRAMUSCULAR | Status: DC | PRN

## 2016-04-28 MED ORDER — COCONUT OIL OIL: 1.0000 "application " | TOPICAL_OIL | Status: DC | PRN

## 2016-04-28 MED ORDER — FENTANYL CITRATE (PF) 100 MCG/2ML IJ SOLN
INTRAMUSCULAR | Status: DC | PRN
  Administered 2016-04-28: 100 ug via INTRAVENOUS

## 2016-04-28 MED ORDER — ONDANSETRON HCL 4 MG/2ML IJ SOLN
INTRAMUSCULAR | Status: DC | PRN
  Administered 2016-04-28: 4 mg via INTRAVENOUS

## 2016-04-28 MED ORDER — SODIUM CHLORIDE 0.9 % IV SOLN: 250.0000 mL | INTRAVENOUS | Status: DC | PRN

## 2016-04-28 MED ORDER — TETANUS-DIPHTH-ACELL PERTUSSIS 5-2.5-18.5 LF-MCG/0.5 IM SUSP: 0.5000 mL | Freq: Once | INTRAMUSCULAR | Status: DC

## 2016-04-28 MED ORDER — BISACODYL 10 MG RE SUPP
10.0000 mg | Freq: Every day | RECTAL | Status: DC | PRN
Start: 2016-04-28 — End: 2016-04-29

## 2016-04-28 MED ORDER — ONDANSETRON HCL 4 MG/2ML IJ SOLN: 4.0000 mg | INTRAMUSCULAR | Status: DC | PRN

## 2016-04-28 MED ORDER — FENTANYL CITRATE (PF) 100 MCG/2ML IJ SOLN
INTRAMUSCULAR | Status: AC
  Filled 2016-04-28: qty 2

## 2016-04-28 MED ORDER — SENNOSIDES-DOCUSATE SODIUM 8.6-50 MG PO TABS
2.0000 | ORAL_TABLET | ORAL | Status: DC
  Administered 2016-04-28: 2 via ORAL
  Filled 2016-04-28: qty 2

## 2016-04-28 MED ORDER — SODIUM BICARBONATE 8.4 % IV SOLN: INTRAVENOUS | Status: DC | PRN

## 2016-04-28 MED ORDER — DEXAMETHASONE SODIUM PHOSPHATE 10 MG/ML IJ SOLN
INTRAMUSCULAR | Status: DC | PRN
  Administered 2016-04-28: 10 mg via INTRAVENOUS

## 2016-04-28 MED ORDER — MEASLES, MUMPS & RUBELLA VAC ~~LOC~~ INJ
0.5000 mL | INJECTION | Freq: Once | SUBCUTANEOUS | Status: DC
  Filled 2016-04-28: qty 0.5

## 2016-04-28 MED ORDER — SUCCINYLCHOLINE CHLORIDE 200 MG/10ML IV SOSY
PREFILLED_SYRINGE | INTRAVENOUS | Status: DC | PRN
  Administered 2016-04-28: 120 mg via INTRAVENOUS

## 2016-04-28 MED ORDER — IBUPROFEN 600 MG PO TABS
600.0000 mg | ORAL_TABLET | Freq: Four times a day (QID) | ORAL | Status: DC
  Administered 2016-04-28 – 2016-04-29 (×5): 600 mg via ORAL
  Filled 2016-04-28 (×4): qty 1

## 2016-04-28 MED ORDER — MIDAZOLAM HCL 2 MG/2ML IJ SOLN
INTRAMUSCULAR | Status: AC
  Filled 2016-04-28: qty 2

## 2016-04-28 MED ORDER — SIMETHICONE 80 MG PO CHEW
80.0000 mg | CHEWABLE_TABLET | ORAL | Status: DC | PRN
  Administered 2016-04-29: 80 mg via ORAL

## 2016-04-28 MED ORDER — BUPIVACAINE HCL (PF) 0.5 % IJ SOLN
INTRAMUSCULAR | Status: AC
  Filled 2016-04-28: qty 30

## 2016-04-28 MED ORDER — PRENATAL MULTIVITAMIN CH: 1.0000 | ORAL_TABLET | Freq: Every day | ORAL | Status: DC

## 2016-04-28 MED ORDER — LACTATED RINGERS IV SOLN
INTRAVENOUS | Status: DC | PRN
Start: 2016-04-28 — End: 2016-04-28
  Administered 2016-04-28 (×3): via INTRAVENOUS

## 2016-04-28 MED ORDER — PROPOFOL 10 MG/ML IV BOLUS
INTRAVENOUS | Status: DC | PRN
  Administered 2016-04-28: 200 mg via INTRAVENOUS

## 2016-04-28 MED ORDER — METOCLOPRAMIDE HCL 10 MG PO TABS
10.0000 mg | ORAL_TABLET | Freq: Once | ORAL | Status: AC
  Administered 2016-04-28: 10 mg via ORAL
  Filled 2016-04-28: qty 1

## 2016-04-28 SURGICAL SUPPLY — 14 items
CLIP FILSHIE TUBAL LIGA STRL (Clip) ×6 IMPLANT
DRSG OPSITE POSTOP 3X4 (GAUZE/BANDAGES/DRESSINGS) ×3 IMPLANT
GLOVE BIOGEL PI IND STRL 7.0 (GLOVE) ×2 IMPLANT
GLOVE BIOGEL PI INDICATOR 7.0 (GLOVE) ×4
GLOVE ECLIPSE 9.0 STRL (GLOVE) ×3 IMPLANT
GLOVE INDICATOR STER SZ 9 (GLOVE) ×3 IMPLANT
GOWN SURG XXL (GOWNS) ×3 IMPLANT
GOWN SURGICAL LARGE (GOWNS) ×3 IMPLANT
NEEDLE HYPO 22GX1.5 SAFETY (NEEDLE) ×3 IMPLANT
PACK VAGINAL MINOR WOMEN LF (CUSTOM PROCEDURE TRAY) ×3 IMPLANT
SLEEVE SCD COMPRESS KNEE MED (MISCELLANEOUS) ×3 IMPLANT
SPONGE LAP 4X18 X RAY DECT (DISPOSABLE) ×3 IMPLANT
SYR CONTROL 10ML LL (SYRINGE) ×3 IMPLANT
TOWEL OR 17X24 6PK STRL BLUE (TOWEL DISPOSABLE) ×6 IMPLANT

## 2016-04-28 NOTE — Anesthesia Postprocedure Evaluation (Signed)
Anesthesia Post Note  Patient: Peggy Hooper  Procedure(s) Performed: Procedure(s) (LRB): BILATERAL TUBAL LIGATION (Bilateral)  Patient location during evaluation: PACU Anesthesia Type: General Level of consciousness: awake and alert, oriented and patient cooperative Pain management: pain level controlled Vital Signs Assessment: post-procedure vital signs reviewed and stable Respiratory status: spontaneous breathing, nonlabored ventilation and respiratory function stable Cardiovascular status: blood pressure returned to baseline and stable Postop Assessment: no signs of nausea or vomiting Anesthetic complications: no     Last Vitals:  Vitals:   04/28/16 1355 04/28/16 1505  BP: 134/86 131/73  Pulse: 79 94  Resp: 18 18  Temp: 36.7 C 36.9 C    Last Pain:  Vitals:   04/28/16 1602  TempSrc:   PainSc: 1    Pain Goal: Patients Stated Pain Goal: 4 (04/28/16 1514)               Erling CruzJACKSON,E. Devinn Hurwitz

## 2016-04-28 NOTE — Progress Notes (Signed)
Patient transferred to my care into room 130. Oriented to room, call bell within reach. Patient states she is not in any pain at this time, assessment is as noted in the flowsheet. Reviewed infant safety paper and plan of care for this morning. She and FOB verbalize their understanding.

## 2016-04-28 NOTE — Addendum Note (Signed)
Addendum  created 04/28/16 1624 by Jairo Benarswell Arion Morgan, MD   Sign clinical note

## 2016-04-28 NOTE — Progress Notes (Signed)
Labor Progress Note Peggy Hooper is a 25 y.o. G1P0 at 6876w5d presented for IOL 2/2 postdates S: Comfortable, epidural in place. Does not currently feel an urge to position   O:  BP 133/79   Pulse 82   Temp 99.4 F (37.4 C) (Oral)   Resp 18   Ht 5\' 6"  (1.676 m)   Wt 107 kg (236 lb)   LMP 07/11/2015   SpO2 97%   BMI 38.09 kg/m  EFM: 130/moderate/accels+/ late decels noted   CVE: Dilation: 10 Dilation Complete Date: 04/27/16 Dilation Complete Time: 2351 Effacement (%): 100 Cervical Position: Posterior Station: +1 Presentation: Vertex Exam by:: Dr. Cathlean CowerMikell   A&P: 25 y.o. G1P0 476w5d for IOL 2/2 postdates  #Labor: Laboring down, change position as patient just started having late's #Pain: Epidural  #FWB: Category 2  #GBS Positive   [name] Angelene GiovanniZ Jacelyn Cuen, MD 12:03 AM

## 2016-04-28 NOTE — Anesthesia Postprocedure Evaluation (Signed)
Anesthesia Post Note  Patient: Peggy Hooper  Procedure(s) Performed: Procedure(s) (LRB): BILATERAL TUBAL LIGATION (Bilateral)  Patient location during evaluation: Mother Baby Anesthesia Type: General Level of consciousness: awake Pain management: pain level controlled Vital Signs Assessment: post-procedure vital signs reviewed and stable Respiratory status: spontaneous breathing Cardiovascular status: stable Postop Assessment: no signs of nausea or vomiting and adequate PO intake Anesthetic complications: no     Last Vitals:  Vitals:   04/28/16 1355 04/28/16 1505  BP: 134/86 131/73  Pulse: 79 94  Resp: 18 18  Temp: 36.7 C 36.9 C    Last Pain:  Vitals:   04/28/16 1514  TempSrc:   PainSc: 2    Pain Goal: Patients Stated Pain Goal: 4 (04/28/16 1514)               [name]

## 2016-04-28 NOTE — Brief Op Note (Addendum)
04/27/2016 - 04/28/2016  11:57 AM  PATIENT:  Peggy Hooper  25 y.o. female  PRE-OPERATIVE DIAGNOSIS:  Desires Sterilization  POST-OPERATIVE DIAGNOSIS:  Desires Sterilization  PROCEDURE:  Procedure(s): BILATERAL TUBAL LIGATION (Bilateral) filshie clips  SURGEON:  Surgeon(s) and Role:    * Tilda BurrowJohn V Craven Crean, MD - Primary  PHYSICIAN ASSISTANT:   ASSISTANTS: none   ANESTHESIA:   local, epidural and general  EBL:  Total I/O In: 1000 [I.V.:1000] Out: 255 [Urine:250; Blood:5]  BLOOD ADMINISTERED:none  DRAINS: none   LOCAL MEDICATIONS USED:  MARCAINE    and Amount: 10 ml  SPECIMEN:  No Specimen  DISPOSITION OF SPECIMEN:  PATHOLOGY  COUNTS:  YES  TOURNIQUET:  * No tourniquets in log *  DICTATION: .Dragon Dictation  PLAN OF CARE: has inpatient admission  PATIENT DISPOSITION:  PACU - hemodynamically stable.   Delay start of Pharmacological VTE agent (>24hrs) due to surgical blood loss or risk of bleeding: not applicable Details of procedure: Patient was taken operating room, prepped and draped for abdominal surgery. The epidural did not give complete relief so general anesthesia was introduced. After timeout was conducted an infraumbilical semicircular 2 cm skin incision was made with sharp dissection through the fascia, careful entry of the peritoneal cavity and palpation identifying a small fundal fibroid with the uterus at U-0. A moistened laparotomy tape was necessary to push the bowel and omentum out of the way so we could see the area of the tube  Attention was directed to the right fallopian tube which was identified in its entirety to the fimbriated end, then a portion of the tube approximately 4 cm from the uterine cornu identified and palpated and a Filshie clip carefully applied going the entire fallopian tube. This was allowed to be returned to the abdomen and attention directed to the left side. The fallopian tube on this side was also identified,, and Filshie clip  applied in a similar fashion. Laparotomy equipment was removed, and 0 Vicryl used to close the peritoneum and fascia and a running suture subcuticular 4-0 Vicryl used to close the skin. Prior to closing the skin Marcaine was used to inject in the fascia and in the skin. Skin edge approximation was achieved and dressing applied. Patient will be allowed to go recovery room and then back to her room Foley catheter placed at the start of the procedure will be removed after recovery room

## 2016-04-28 NOTE — Anesthesia Postprocedure Evaluation (Signed)
Anesthesia Post Note  Patient: Peggy Hooper  Procedure(s) Performed: * No procedures listed *  Patient location during evaluation: Mother Baby Anesthesia Type: Epidural Level of consciousness: awake and alert and oriented Pain management: satisfactory to patient Vital Signs Assessment: post-procedure vital signs reviewed and stable Respiratory status: spontaneous breathing and nonlabored ventilation Cardiovascular status: stable Postop Assessment: no headache, no backache, no signs of nausea or vomiting, adequate PO intake and patient able to bend at knees (patient up walking) Anesthetic complications: no     Last Vitals:  Vitals:   04/28/16 0430 04/28/16 0500  BP: (!) 144/79 (!) 145/78  Pulse: 83 84  Resp: 18 18  Temp: 37.1 C 36.9 C    Last Pain:  Vitals:   04/28/16 0500  TempSrc: Oral  PainSc: 0-No pain   Pain Goal: Patients Stated Pain Goal: 4 (04/27/16 1614)               Madison HickmanGREGORY,Dilcia Rybarczyk

## 2016-04-28 NOTE — Progress Notes (Signed)
Patient ambulated out of bed into the bathroom with minimal to no assistance. Voided 450 of clear yellow urine, ambulated into bed with no assistance.

## 2016-04-28 NOTE — Transfer of Care (Signed)
Immediate Anesthesia Transfer of Care Note  Patient: Peggy Hooper  Procedure(s) Performed: Procedure(s): BILATERAL TUBAL LIGATION (Bilateral)  Patient Location: PACU  Anesthesia Type:General  Level of Consciousness: awake, alert  and oriented  Airway & Oxygen Therapy: Patient Spontanous Breathing and Patient connected to nasal cannula oxygen  Post-op Assessment: Report given to RN and Post -op Vital signs reviewed and stable  Post vital signs: Reviewed and stable  Last Vitals:  Vitals:   04/28/16 1215 04/28/16 1230  BP: 134/75 132/84  Pulse: 76 71  Resp: 19 16  Temp:      Last Pain:  Vitals:   04/28/16 1230  TempSrc:   PainSc: Asleep      Patients Stated Pain Goal: 4 (04/27/16 1614)  Complications: No apparent anesthesia complications

## 2016-04-28 NOTE — Anesthesia Procedure Notes (Signed)
Procedure Name: Intubation Date/Time: 04/28/2016 11:10 AM Performed by: Rica RecordsICKELTON, Eris Hannan Pre-anesthesia Checklist: Patient identified, Emergency Drugs available, Suction available and Patient being monitored Patient Re-evaluated:Patient Re-evaluated prior to inductionOxygen Delivery Method: Circle system utilized Preoxygenation: Pre-oxygenation with 100% oxygen Intubation Type: IV induction Ventilation: Mask ventilation without difficulty Laryngoscope Size: Glidescope Grade View: Grade II Tube type: Oral Number of attempts: 1 Airway Equipment and Method: Video-laryngoscopy Secured at: 21 cm

## 2016-04-28 NOTE — Lactation Note (Signed)
This note was copied from a baby's chart. Lactation Consultation Note  Patient Name: Peggy Hooper'UToday's Date: 04/28/2016 Reason for consult: Initial assessment;Difficult latch Mom and RN have not been able to get baby to sustain a latch. Mom has large pendulous breasts and nipples with short shaft. Nipples are compressible. Baby not wanting to open her mouth, tight mouth, tongue thrusting. Suck training done, hand expressed for baby to taste some colostrum. After several attempts and using breast compression, LC holding nipple in baby's mouth, she was able to start developing a few good suckling bursts. Baby nursed off/on for 15 minutes. Laid back position worked the best due to CMS Energy CorporationMom's large breasts. Encouraged Mom to BF with feeding ques, ask for assist with latch. Basic teaching done. DEBP has been set up, advised to pump every 3 hours for 15 minutes to encourage milk production and to have EBM to supplement if needed should baby continue to have difficulty sustaining the latch. Lactation brochure left for review, advised of OP services and support group. Encouraged to wear breast shells.   Maternal Data Has patient been taught Hand Expression?: Yes Does the patient have breastfeeding experience prior to this delivery?: No  Feeding Feeding Type: Breast Fed Length of feed: 15 min (off/on)  LATCH Score/Interventions Latch: Repeated attempts needed to sustain latch, nipple held in mouth throughout feeding, stimulation needed to elicit sucking reflex. Intervention(s): Adjust position;Assist with latch;Breast massage;Breast compression  Audible Swallowing: A few with stimulation Intervention(s): Skin to skin  Type of Nipple: Everted at rest and after stimulation (short nipple shafts bilateral) Intervention(s): Shells;Double electric pump  Comfort (Breast/Nipple): Soft / non-tender     Hold (Positioning): Full assist, staff holds infant at breast Intervention(s): Breastfeeding basics  reviewed;Support Pillows;Position options;Skin to skin  LATCH Score: 6  Lactation Tools Discussed/Used Pump Review: Setup, frequency, and cleaning;Milk Storage   Consult Status Consult Status: Follow-up Date: 04/29/16 Follow-up type: In-patient    Alfred LevinsGranger, Virginia Curl Ann 04/28/2016, 9:14 PM

## 2016-04-28 NOTE — Op Note (Signed)
Please see the brief operative note for surgical details 

## 2016-04-28 NOTE — Addendum Note (Signed)
Addendum  created 04/28/16 1520 by Renford DillsJanet L Kyrie Fludd, CRNA   Sign clinical note

## 2016-04-28 NOTE — Addendum Note (Signed)
Addendum  created 04/28/16 1739 by Rica RecordsAngela Haydin Dunn, CRNA   Anesthesia Staff edited

## 2016-04-28 NOTE — Progress Notes (Signed)
Spoke with Dr. Emelda FearFerguson regarding pts steadily rising BP readings. Edema noted to lower extremities but no other signs of pre-eclampsia.  Orders received and okay given to discharge pt to floor with elevated BP's.

## 2016-04-28 NOTE — Progress Notes (Signed)
Patient ID: Peggy Hooper, female   DOB: 03/23/1991, 25 y.o.   MRN: [id number] Peggy Rayurica Jerez is a 25 y.o. G1P0 at 6068w5d admitted for induction of labor due to Post dates. Due date 8/8.  Called by RN who checked pt and still felt cx  Subjective: Pt comfortable, feeling some pain in Rt lower abdomen w/ uc's, no pressure  Objective: BP 127/78   Pulse 89   Temp 99.4 F (37.4 C) (Oral)   Resp 18   Ht 5\' 6"  (1.676 m)   Wt 107 kg (236 lb)   LMP 07/11/2015   SpO2 97%   BMI 38.09 kg/m  No intake/output data recorded.  FHT:  FHR: 140 bpm, variability: moderate,  accelerations:  Present 10x10,  decelerations:  Present early UC:   q 1-655mins, will couple at times, MVUs mostly adequate since IUPC placement, has periods closer to inadequate when uc's couple/space  SVE:   Dilation: 8 Effacement (%): 100 Station: +1 Exam by:: K. Jayline Kilburg Caput at +2  Pitocin @ 12 mu/min  Labs: Lab Results  Component Value Date   WBC 5.6 04/27/2016   HGB 8.9 (L) 04/27/2016   HCT 26.5 (L) 04/27/2016   MCV 85.8 04/27/2016   PLT 185 04/27/2016    Assessment / Plan: IOL d/t postdates, 8cm x 5hr w/ mostly adequate uc's w/ IUPC, some coupling. Vtx is lower from my last exam, now at +1 and caput at +2. Possible OP, placed in Rt exaggerated sims w/ peanut ball. Discussed all w/ Dr. Alysia PennaErvin, to recheck in about 1-1.5hrs and let him know if still unchanged.   Labor: protracted active phase Fetal Wellbeing:  Category I Pain Control:  Epidural Pre-eclampsia: GHTN, pre-e labs normal I/D:  pcn for gbs+ Anticipated MOD:  to be determined   Marge DuncansBooker, Stephnie Parlier Randall CNM, WHNP-BC 04/28/2016, 1:17 AM

## 2016-04-28 NOTE — Addendum Note (Signed)
Addendum  created 04/28/16 1611 by Rica RecordsAngela Syndey Jaskolski, CRNA   Anesthesia Event edited

## 2016-04-28 NOTE — Plan of Care (Signed)
Problem: Nutritional: Goal: Mother's verbalization of comfort with breastfeeding process will improve Outcome: Not Progressing Mother has chosen to feed her infant with breast milk. Attempts to latch infant have not been successful. Mother's nipples are wide and short-shafted. Her areolas are tough and leathery and difficult to compress. Mother has been set up with a double electric breast pump and has now used it twice, initially pumping approximately 13 ml. Colostrum which was finger-fed to the infant early this afternoon. Feedings have also been somewhat delayed since birth by the fact that the infant has been sleepy and the mother had a bilateral tubal ligation this morning and was away from the unit for several hours. Mother encouraged to put the infant skin-to-skin frequently and to use the DEBP at least every 3 hours. Oleta MouseKathy Granger, IBCLC advised.

## 2016-04-29 ENCOUNTER — Encounter (HOSPITAL_COMMUNITY): Payer: Self-pay | Admitting: Obstetrics and Gynecology

## 2016-04-29 MED ORDER — SENNOSIDES-DOCUSATE SODIUM 8.6-50 MG PO TABS: 2.0000 | ORAL_TABLET | ORAL | 0 refills | Status: DC

## 2016-04-29 MED ORDER — IBUPROFEN 600 MG PO TABS: 600.0000 mg | ORAL_TABLET | Freq: Four times a day (QID) | ORAL | 0 refills | Status: DC

## 2016-04-29 NOTE — Discharge Instructions (Signed)
Vaginal Delivery, Care After °Refer to this sheet in the next few weeks. These discharge instructions provide you with information on caring for yourself after delivery. Your health care provider may also give you specific instructions. Your treatment has been planned according to the most current medical practices available, but problems sometimes occur. Call your health care provider if you have any problems or questions after you go home. °HOME CARE INSTRUCTIONS °· Take over-the-counter or prescription medicines only as directed by your health care provider or pharmacist. °· Do not drink alcohol, especially if you are breastfeeding or taking medicine to relieve pain. °· Do not chew or smoke tobacco. °· Do not use illegal drugs. °· Continue to use good perineal care. Good perineal care includes: °¨ Wiping your perineum from front to back. °¨ Keeping your perineum clean. °· Do not use tampons or douche until your health care provider says it is okay. °· Shower, wash your hair, and take tub baths as directed by your health care provider. °· Wear a well-fitting bra that provides breast support. °· Eat healthy foods. °· Drink enough fluids to keep your urine clear or pale yellow. °· Eat high-fiber foods such as whole grain cereals and breads, brown rice, beans, and fresh fruits and vegetables every day. These foods may help prevent or relieve constipation. °· Follow your health care provider's recommendations regarding resumption of activities such as climbing stairs, driving, lifting, exercising, or traveling. °· Talk to your health care provider about resuming sexual activities. Resumption of sexual activities is dependent upon your risk of infection, your rate of healing, and your comfort and desire to resume sexual activity. °· Try to have someone help you with your household activities and your newborn for at least a few days after you leave the hospital. °· Rest as much as possible. Try to rest or take a nap  when your newborn is sleeping. °· Increase your activities gradually. °· Keep all of your scheduled postpartum appointments. It is very important to keep your scheduled follow-up appointments. At these appointments, your health care provider will be checking to make sure that you are healing physically and emotionally. °SEEK MEDICAL CARE IF:  °· You are passing large clots from your vagina. Save any clots to show your health care provider. °· You have a foul smelling discharge from your vagina. °· You have trouble urinating. °· You are urinating frequently. °· You have pain when you urinate. °· You have a change in your bowel movements. °· You have increasing redness, pain, or swelling near your vaginal incision (episiotomy) or vaginal tear. °· You have pus draining from your episiotomy or vaginal tear. °· Your episiotomy or vaginal tear is separating. °· You have painful, hard, or reddened breasts. °· You have a severe headache. °· You have blurred vision or see spots. °· You feel sad or depressed. °· You have thoughts of hurting yourself or your newborn. °· You have questions about your care, the care of your newborn, or medicines. °· You are dizzy or light-headed. °· You have a rash. °· You have nausea or vomiting. °· You were breastfeeding and have not had a menstrual period within 12 weeks after you stopped breastfeeding. °· You are not breastfeeding and have not had a menstrual period by the 12th week after delivery. °· You have a fever. °SEEK IMMEDIATE MEDICAL CARE IF:  °· You have persistent pain. °· You have chest pain. °· You have shortness of breath. °· You faint. °· You   have leg pain.  You have stomach pain.  Your vaginal bleeding saturates two or more sanitary pads in 1 hour.   This information is not intended to replace advice given to you by your health care provider. Make sure you discuss any questions you have with your health care provider.   Document Released: 08/23/2000 Document Revised:  05/17/2015 Document Reviewed: 04/22/2012 Elsevier Interactive Patient Education 2016 [employer].   Hypertension During Pregnancy Hypertension, or high blood pressure, is when there is extra pressure inside your blood vessels that carry blood from the heart to the rest of your body (arteries). It can happen at any time in life, including pregnancy. Hypertension during pregnancy can cause problems for you and your baby. Your baby might not weigh as much as he or she should at birth or might be born early (premature). Very bad cases of hypertension during pregnancy can be life-threatening.  Different types of hypertension can occur during pregnancy. These include:  Chronic hypertension. This happens when a woman has hypertension before pregnancy and it continues during pregnancy.  Gestational hypertension. This is when hypertension develops during pregnancy.  Preeclampsia or toxemia of pregnancy. This is a very serious type of hypertension that develops only during pregnancy. It affects the whole body and can be very dangerous for both mother and baby.  Gestational hypertension and preeclampsia usually go away after your baby is born. Your blood pressure will likely stabilize within 6 weeks. Women who have hypertension during pregnancy have a greater chance of developing hypertension later in life or with future pregnancies. RISK FACTORS There are certain factors that make it more likely for you to develop hypertension during pregnancy. These include:  Having hypertension before pregnancy.  Having hypertension during a previous pregnancy.  Being overweight.  Being older than 40 years.  Being pregnant with more than one baby.  Having diabetes or kidney problems. SIGNS AND SYMPTOMS Chronic and gestational hypertension rarely cause symptoms. Preeclampsia has symptoms, which may include:  Increased protein in your urine. Your health care provider will check for this at every prenatal  visit.  Swelling of your hands and face.  Rapid weight gain.  Headaches.  Visual changes.  Being bothered by light.  Abdominal pain, especially in the upper right area.  Chest pain.  Shortness of breath.  Increased reflexes.  Seizures. These occur with a more severe form of preeclampsia, called eclampsia. DIAGNOSIS  You may be diagnosed with hypertension during a regular prenatal exam. At each prenatal visit, you may have:  Your blood pressure checked.  A urine test to check for protein in your urine. The type of hypertension you are diagnosed with depends on when you developed it. It also depends on your specific blood pressure reading.  Developing hypertension before 20 weeks of pregnancy is consistent with chronic hypertension.  Developing hypertension after 20 weeks of pregnancy is consistent with gestational hypertension.  Hypertension with increased urinary protein is diagnosed as preeclampsia.  Blood pressure measurements that stay above 160 systolic or 110 diastolic are a sign of severe preeclampsia. TREATMENT Treatment for hypertension during pregnancy varies. Treatment depends on the type of hypertension and how serious it is.  If you take medicine for chronic hypertension, you may need to switch medicines.  Medicines called ACE inhibitors should not be taken during pregnancy.  Low-dose aspirin may be suggested for women who have risk factors for preeclampsia.  If you have gestational hypertension, you may need to take a blood pressure medicine that is  safe during pregnancy. Your health care provider will recommend the correct medicine.  If you have severe preeclampsia, you may need to be in the hospital. Health care providers will watch you and your baby very closely. You also may need to take medicine called magnesium sulfate to prevent seizures and lower blood pressure.  Sometimes, an early delivery is needed. This may be the case if the condition  worsens. It would be done to protect you and your baby. The only cure for preeclampsia is delivery.  Your health care provider may recommend that you take one low-dose aspirin (81 mg) each day to help prevent high blood pressure during your pregnancy if you are at risk for preeclampsia. You may be at risk for preeclampsia if:  You had preeclampsia or eclampsia during a previous pregnancy.  Your baby did not grow as expected during a previous pregnancy.  You experienced preterm birth with a previous pregnancy.  You experienced a separation of the placenta from the uterus (placental abruption) during a previous pregnancy.  You experienced the loss of your baby during a previous pregnancy.  You are pregnant with more than one baby.  You have other medical conditions, such as diabetes or an autoimmune disease. HOME CARE INSTRUCTIONS  Schedule and keep all of your regular prenatal care appointments. This is important.  Take medicines only as directed by your health care provider. Tell your health care provider about all medicines you take.  Eat as little salt as possible.  Get regular exercise.  Do not drink alcohol.  Do not use tobacco products.  Do not drink products with caffeine.  Lie on your left side when resting. SEEK IMMEDIATE MEDICAL CARE IF:  You have severe abdominal pain.  You have sudden swelling in your hands, ankles, or face.  You gain 4 pounds (1.8 kg) or more in 1 week.  You vomit repeatedly.  You have vaginal bleeding.  You do not feel your baby moving as much.  You have a headache.  You have blurred or double vision.  You have muscle twitching or spasms.  You have shortness of breath.  You have blue fingernails or lips.  You have blood in your urine. MAKE SURE YOU:  Understand these instructions.  Will watch your condition.  Will get help right away if you are not doing well or get worse.   This information is not intended to replace  advice given to you by your health care provider. Make sure you discuss any questions you have with your health care provider.   Document Released: 05/14/2011 Document Revised: 09/16/2014 Document Reviewed: 03/25/2013 Elsevier Interactive Patient Education 2016 [employer].   Breastfeeding Challenges and Solutions Even though breastfeeding is natural, it can be challenging, especially in the first few weeks after childbirth. It is normal for problems to arise when starting to breastfeed your new baby, even if you have breastfed before. This document provides some solutions to the most common breastfeeding challenges.  CHALLENGES AND SOLUTIONS Challenge--Cracked or Sore Nipples Cracked or sore nipples are commonly experienced by breastfeeding mothers. Cracked or sore nipples often are caused by inadequate latching (when your baby's mouth attaches to your breast to breastfeed). Soreness can also happen if your baby is not positioned properly at your breast. Although nipple cracking and soreness are common during the first week after birth, nipple pain is never normal. If you experience nipple cracking or soreness that lasts longer than 1 week or nipple pain, call your health care provider  or [employer].  Solution Ensure proper latching and positioning of your baby by following the steps below:  Find a comfortable place to sit or lie down, with your neck and back well supported.  Place a pillow or rolled up blanket under your baby to bring him or her to the level of your breast (if you are seated).  Make sure that your baby's abdomen is facing your abdomen.  Gently massage your breast. With your fingertips, massage from your chest wall toward your nipple in a circular motion. This encourages milk flow. You may need to continue this action during the feeding if your milk flows slowly.  Support your breast with 4 fingers underneath and your thumb above your nipple. Make sure your fingers  are well away from your nipple and your baby's mouth.  Stroke your baby's lips gently with your finger or nipple.  When your baby's mouth is open wide enough, quickly bring your baby to your breast, placing your entire nipple and as much of the colored area around your nipple (areola) as possible into your baby's mouth.  More areola should be visible above your baby's upper lip than below the lower lip.  Your baby's tongue should be between his or her lower gum and your breast.  Ensure that your baby's mouth is correctly positioned around your nipple (latched). Your baby's lips should create a seal on your breast and be turned out (everted).  It is common for your baby to suck for about 2-3 minutes in order to start the flow of breast milk. Signs that your baby has successfully latched on to your nipple include:   Quietly tugging or quietly sucking without causing you pain.   Swallowing heard between every 3-4 sucks.   Muscle movement above and in front of his or her ears with sucking.  Signs that your baby has not successfully latched on to nipple include:   Sucking sounds or smacking sounds from your baby while nursing.   Nipple pain.  Ensure that your breasts stay moisturized and healthy by:  Avoiding the use of soap on your nipples.   Wearing a supportive bra. Avoid wearing underwire-style bras or tight bras.   Air drying your nipples for 3-4 minutes after each feeding.   Using only cotton bra pads to absorb breast milk leakage. Leaking of breast milk between feedings is normal. Be sure to change the pads if they become soaked with milk.  Using lanolin on your nipples after nursing. Lanolin helps to maintain your skin's normal moisture barrier. If you use pure lanolin you do not need to wash it off before feeding your baby again. Pure lanolin is not toxic to your baby. You may also hand express a few drops of breast milk and gently massage that milk into your nipples,  allowing it to air dry. Challenge--Breast Engorgement Breast engorgement is the overfilling of your breasts with breast milk. In the first few weeks after giving birth, you may experience breast engorgement. Breast engorgement can make your breasts throb and feel hard, tightly stretched, warm, and tender. Engorgement peaks about the fifth day after you give birth. Having breast engorgement does not mean you have to stop breastfeeding your baby. Solution  Breastfeed when you feel the need to reduce the fullness of your breasts or when your baby shows signs of hunger. This is called "breastfeeding on demand."  Newborns (babies younger than 4 weeks) often breastfeed every 1-3 hours during the day. You may need to  awaken your baby to feed if he or she is asleep at a feeding time.  Do not allow your baby to sleep longer than 5 hours during the night without a feeding.  Pump or hand express breast milk before breastfeeding to soften your breast, areola, and nipple.  Apply warm, moist heat (in the shower or with warm water-soaked hand towels) just before feeding or pumping, or massage your breast before or during breastfeeding. This increases circulation and helps your milk to flow.  Completely empty your breasts when breastfeeding or pumping. Afterward, wear a snug bra (nursing or regular) or tank top for 1-2 days to signal your body to slightly decrease milk production. Only wear snug bras or tank tops to treat engorgement. Tight bras typically should be avoided by breastfeeding mothers. Once engorgement is relieved, return to wearing regular, loose-fitting clothes.  Apply ice packs to your breasts to lessen the pain from engorgement and relieve swelling, unless the ice is uncomfortable for you.  Do not delay feedings. Try to relax when it is time to feed your baby. This helps to trigger your "let-down reflex," which releases milk from your breast.  Ensure your baby is latched on to your breast and  positioned properly while breastfeeding.  Allow your baby to remain at your breast as long as he or she is latched on well and actively sucking. Your baby will let you know when he or she is done breastfeeding by pulling away from your breast or falling asleep.  Avoid introducing bottles or pacifiers to your baby in the early weeks of breastfeeding. Wait to introduce these things until after resolving any breastfeeding challenges.  Try to pump your milk on the same schedule as when your baby would breastfeed if you are returning to work or away from home for an extended period.  Drink plenty of fluids to avoid dehydration, which can eventually put you at greater risk of breast engorgement. If you follow these suggestions, your engorgement should improve in 24-48 hours. If you are still experiencing difficulty, call your lactation consultant or health care provider.  Challenge--Plugged Milk Ducts Plugged milk ducts occur when the duct does not drain milk effectively and becomes swollen. Wearing a tight-fitting nursing bra or having difficulty with latching may cause plugged milk ducts. Not drinking enough water (8-10 c [1.9-2.4 L] per day) can contribute to plugged milk ducts. Once a duct has become plugged, hard lumps, soreness, and redness may develop in your breast.  Solution Do not delay feedings. Feed your baby frequently and try to empty your breasts of milk at each feeding. Try breastfeeding from the affected side first so there is a better chance that the milk will drain completely from that breast. Apply warm, moist towels to your breasts for 5-10 minutes before feeding. Alternatively, a hot shower right before breastfeeding can provide the moist heat that can encourage milk flow. Gentle massage of the sore area before and during a feeding may also help. Avoid wearing tight clothing or bras that put pressure on your breasts. Wear bras that offer good support to your breasts, but avoid underwire  bras. If you have a plugged milk duct and develop a fever, you need to see your health care provider.  Challenge--Mastitis Mastitis is inflammation of your breast. It usually is caused by a bacterial infection and can cause flu-like symptoms. You may develop redness in your breast and a fever. Often when mastitis occurs, your breast becomes firm, warm, and very painful. The  most common causes of mastitis are poor latching, ineffective sucking from your baby, consistent pressure on your breast (possibly from wearing a tight-fitting bra or shirt that restricts the milk flow), unusual stress or fatigue, or missed feedings.  Solution You will be given antibiotic medicine to treat the infection. It is still important to breastfeed frequently to empty your breasts. Continuing to breastfeed while you recover from mastitis will not harm your baby. Make sure your baby is positioned properly during every feeding. Apply moist heat to your breasts for a few minutes before feeding to help the milk flow and to help your breasts empty more easily. Challenge--Thrush Ginette Pitmanhrush is a yeast infection that can form on your nipples, in your breast, or in your baby's mouth. It causes itching, soreness, burning or stabbing pain, and sometimes a rash.  Solution You will be given a medicated ointment for your nipples, and your baby will be given a liquid medicine for his or her mouth. It is important that you and your baby are treated at the same time because thrush can be passed between you and your baby. Change disposable nursing pads often. Any bras, towels, or clothing that come in contact with infected areas of your body or your baby's body need to be washed in very hot water every day. Wash your hands and your baby's hands often. All pacifiers, bottle nipples, or toys your baby puts in his or her mouth should be boiled once a day for 20 minutes. After 1 week of treatment, discard pacifiers and bottle nipples and buy new ones. All  breast pump parts that touch the milk need to be boiled for 20 minutes every day. Challenge--Low Milk Supply You may not be producing enough milk if your baby is not gaining the proper amount of weight. Breast milk production is based on a supply-and-demand system. Your milk supply depends on how frequently and effectively your baby empties your breast. Solution The more you breastfeed and pump, the more breast milk you will produce. It is important that your baby empties at least one of your breasts at each feeding. If this is not happening, then use a breast pump or hand express any milk that remains. This will help to drain as much milk as possible at each feeding. It will also signal your body to produce more milk. If your baby is not emptying your breasts, it may be due to latching, sucking, or positioning problems. If low milk supply continues after addressing these issues, contact your health care provider or a lactation specialist as soon as possible. Challenge--Inverted or Flat Nipples Some women have nipples that turn inward instead of protruding outward. Other women have nipples that are flat. Inverted or flat nipples can sometimes make it more difficult for your baby to latch onto your breast. Solution You may be given a small device that pulls out inverted nipples. This device should be applied right before your baby is brought to your breast. You can also try using a breast pump for a short time before placing the baby at your breast. The pump can pull your nipple outwards to help your infant latch more easily. The baby's sucking motion will help the inverted nipple protrude as well.  If you have flat nipples, encourage your baby to latch onto your breast and feed frequently in the early days after birth. This will give your baby practice latching on correctly while your breast is still soft. When your milk supply increases, between the second  and fifth day after birth and your breasts become  full, your baby will have an easier time latching.  Contact a lactation consultant if you still have concerns. She or he can teach you additional techniques to address breastfeeding problems related to nipple shape and position.  FOR MORE INFORMATION [employer]: www.llli.org   This information is not intended to replace advice given to you by your health care provider. Make sure you discuss any questions you have with your health care provider.   Document Released: 02/17/2006 Document Revised: 09/16/2014 Document Reviewed: 02/19/2013 Elsevier Interactive Patient Education [employer]

## 2016-04-29 NOTE — Discharge Summary (Signed)
OB Discharge Summary     Patient Name: Peggy Hooper DOB: April 05, 1991 MRN: [id number]  Date of admission: 04/27/2016 Delivering MD: Shawna Clamp R   Date of discharge: 04/29/2016  Admitting diagnosis: INDUCTION post partum Intrauterine pregnancy: [redacted]w[redacted]d     Secondary diagnosis:  Active Problems:   Post-dates pregnancy   Encounter for sterilization  Additional problems: h/o MDD and suicide attempt     Discharge diagnosis: Term Pregnancy Delivered                                                                                                Post partum procedures:postpartum tubal ligation  Augmentation: AROM, Pitocin, Cytotec and Foley Balloon  Complications: None  Hospital course:  Induction of Labor With Vaginal Delivery   25 y.o. yo G1P1001 at 110w5d was admitted to the hospital 04/27/2016 for induction of labor.  Indication for induction: Postdates.  Patient had an uncomplicated labor course as follows: Membrane Rupture Time/Date: 10:41 AM ,04/27/2016   Intrapartum Procedures: Episiotomy: None [1]                                         Lacerations:  None [1]  Patient had delivery of a Viable infant.  Information for the patient's newborn:  Retia, Cordle [[id number]]  Delivery Method: Vag-Spont   04/28/2016  Details of delivery can be found in separate delivery note.  Patient had a routine postpartum course. Patient is discharged home 04/29/16.   Physical exam  Vitals:   04/28/16 1933 04/28/16 2330 04/29/16 0400 04/29/16 0515  BP: 138/67 131/72 136/67 126/61  Pulse: 88 94 76 86  Resp: 18 18 18 18   Temp: 98.6 F (37 C) 98.2 F (36.8 C) 98.4 F (36.9 C) 98.4 F (36.9 C)  TempSrc: Axillary Oral Oral Oral  SpO2:  99% 99%   Weight:      Height:       General: alert, cooperative and no distress Lochia: appropriate Uterine Fundus: firm Incision s/p BTL: Healing well with no significant drainage, No significant erythema, Dressing is clean, dry, and  intact DVT Evaluation: No evidence of DVT seen on physical exam. Negative Homan's sign. Labs: Lab Results  Component Value Date   WBC 9.9 04/28/2016   HGB 8.3 (L) 04/28/2016   HCT 24.1 (L) 04/28/2016   MCV 85.8 04/28/2016   PLT 154 04/28/2016   CMP Latest Ref Rng & Units 04/27/2016  Glucose 65 - 99 mg/dL 93  BUN 6 - 20 mg/dL 12  Creatinine 7.82 - 9.56 mg/dL 2.13  Sodium 086 - 578 mmol/L 136  Potassium 3.5 - 5.1 mmol/L 3.9  Chloride 101 - 111 mmol/L 107  CO2 22 - 32 mmol/L 20(L)  Calcium 8.9 - 10.3 mg/dL 9.1  Total Protein 6.5 - 8.1 g/dL 6.2(L)  Total Bilirubin 0.3 - 1.2 mg/dL 0.3  Alkaline Phos 38 - 126 U/L 164(H)  AST 15 - 41 U/L 17  ALT 14 - 54 U/L 9(L)    Discharge instruction: per  After Visit Summary and "Baby and Me Booklet".  After visit meds:    Medication List    TAKE these medications   ibuprofen 600 MG tablet Commonly known as:  ADVIL,MOTRIN Take 1 tablet (600 mg total) by mouth every 6 (six) hours.   PRENATAL COMPLETE 14-0.4 MG Tabs Take 1 tablet by mouth 2 (two) times daily.   senna-docusate 8.6-50 MG tablet Commonly known as:  Senokot-S Take 2 tablets by mouth daily.       Diet: routine diet  Activity: Advance as tolerated. Pelvic rest for 6 weeks.   Outpatient follow up:6 weeks Follow up Appt:No future appointments. Follow up Visit: Follow-up Information    [facility] Schedule an appointment as soon as possible for a visit in 6 week(s).   Why:  for postpartum visit Contact information: [address] [place] [address] [phone]        [facility]  .   Why:  as needed for any potential emergencies Contact information: [address] [place] [phone]          Postpartum contraception: Tubal Ligation  Newborn Data: Live born female  Birth Weight: 7 lb 8.8 oz (3425 g) APGAR: 5, 8  Baby Feeding: Breast Disposition:home with  mother   04/29/2016 Leland HerElsia J Yoo, DO  Midwife attestation I have seen and examined this patient and agree with above documentation in the resident's note.   Peggy Hooper is a 25 y.o. G1P1001 s/p NSVD and BTL.   Pain is well controlled.   Method of Feeding: breast  PE:  BP 135/76 (BP Location: Right Arm)   Pulse 67   Temp 97.9 F (36.6 C) (Oral)   Resp 16   Ht 5\' 6"  (1.676 m)   Wt 107 kg (236 lb)   LMP 07/11/2015   SpO2 97%   Breastfeeding? Unknown   BMI 38.09 kg/m   Patient Vitals for the past 24 hrs:  BP Temp Temp src Pulse Resp SpO2  04/29/16 0800 135/76 97.9 F (36.6 C) Oral 67 16 97 %  04/29/16 0515 126/61 98.4 F (36.9 C) Oral 86 18 -  04/29/16 0400 136/67 98.4 F (36.9 C) Oral 76 18 99 %  04/28/16 2330 131/72 98.2 F (36.8 C) Oral 94 18 99 %  04/28/16 1933 138/67 98.6 F (37 C) Axillary 88 18 -  04/28/16 1505 131/73 98.4 F (36.9 C) Axillary 94 18 96 %  04/28/16 1355 134/86 98 F (36.7 C) Axillary 79 18 96 %  04/28/16 1344 (!) 142/99 - - 88 - -  04/28/16 1330 (!) 142/99 - - 71 18 93 %  04/28/16 1315 - 98 F (36.7 C) - - 18 -  04/28/16 1300 (!) 137/91 - - 71 16 98 %  04/28/16 1245 134/87 - - 76 17 97 %  04/28/16 1230 132/84 - - 71 16 98 %  04/28/16 1215 134/75 - - 76 19 98 %  04/28/16 1210 126/77 97.7 F (36.5 C) - 78 12 98 %   Gen: well appearing Heart: reg rate Lungs: normal WOB Fundus firm Ext: soft, no pain, no edema Incision from BTL with honeycomb dressing dry and intact   Recent Labs  04/27/16 0150 04/28/16 0616  HGB 8.9* 8.3*  HCT 26.5* 24.1*     Plan: discharge today - postpartum care discussed --Baby love nurse visit this week for BP check --Ibuprofen Rx written and will keep since pt only HTN was in hospital  and is normotensive now but discussed with pt to use sparingly r/t BP, try Tylenol first - f/u clinic in 6 weeks for postpartum visit. [facility]   LEFTWICH-KIRBY, Braylynn Lewing,  CNM 10:01 AM

## 2016-04-29 NOTE — Clinical Social Work Maternal (Signed)
  CLINICAL SOCIAL WORK MATERNAL/CHILD NOTE  Patient Details  Name: Peggy Hooper MRN: [id number] Date of Birth: 10-Jul-1991  Date:  04/29/2016  Clinical Social Worker Initiating Note:  Laurey Arrow Date/ Time Initiated:  04/29/16/1504     Child's Name:  Peggy Hooper   Legal Guardian:  Mother   Need for Interpreter:  None   Date of Referral:  04/28/16     Reason for Referral:  Behavioral Health Issues, including SI    Referral Source:  Central Nursery   Address:  [address] [address]  Phone number:  [phone]   Household Members:  Self, Significant Other   Natural Supports (not living in the home):  Extended Family, Immediate Family, Spouse/significant other   Professional Supports: None   Employment:     Type of Work:     Education:  Database administrator Resources:  [employer]   Other Resources:  [employer], Physicist, medical    Cultural/Religious Considerations Which May Impact Care:  None reported  Strengths:  Ability to meet basic needs , Home prepared for child , Pediatrician chosen    Risk Factors/Current Problems:  Mental Health Concerns    Cognitive State:  Alert , Insightful    Mood/Affect:  Calm , Flat , Interested , Comfortable    CSW Assessment: CSW met with MOB to complete an assessment for hx of depression and SI.  MOB introduced her room guest as FOB (Darrin Rice).  MOB gave CSW permission to meet with MOB while FOB was present.  CSW inquired about MOB's mental health.  Initially MOB denied having any MH concerns and denied hx of SI.  With MOB's permission, CSW asked FOB to leave the room, so CSW could talk with MOB in private.  CSW asked MOB again about her MH hx and MOB denied having any hx.  CSW reminded MOB of  MOB's hospitalization at [facility] for depression. MOB immediately recalled MOB's hospitalization and communicated to CSW, that MOB had forgotten about the hospitalization.  MOB stated that FOB was aware of MOB's  hospitalization and informed CSW that FOB was the person that transported MOB to the hospital.  MOB acknowledged her hospital stay and informed CSW that MOB was experiencing some financial hardship and feelings of being overwhelmed at the time of the hospitalizaion.  MOB denied being placed on medication and reported that MOB did not follow-up with therapy.  CSW validated MOB's thoughts and feelings and offered MOB resources for outpatient behavioral health counseling.  MOB declined resources and reported that MOB will reach out to the Health department OBGYN office if a need arise.  CSW educated MOB about PPD. CSW reviewed signs and symptoms for PPD and encouraged MOB to ask questions. CSW informed MOB of supports and interventions to decrease PPD.  CSW also encouraged MOB to seek medical attention if needed for increased signs and symptoms for PPD.  MOB did not have any further questions, concerns, or needs at this time.  CSW thanked MOB for meeting with CSW and encouraged MOB to reach out to CSW if a need arise.   CSW Plan/Description:  Patient/Family Education , No Further Intervention Required/No Barriers to Discharge, Other (Comment)   Laurey Arrow, MSW, LCSW Clinical Social Work [phone]   Dimple Nanas, LCSW 04/29/2016, 3:06 PM

## 2016-04-30 ENCOUNTER — Telehealth (HOSPITAL_COMMUNITY): Payer: Self-pay | Admitting: Lactation Services

## 2016-04-30 ENCOUNTER — Ambulatory Visit: Payer: Self-pay

## 2016-04-30 NOTE — Telephone Encounter (Signed)
Mom reports right breast is engorged, baby will not latch to the right breast and she cannot get milk moving with pumping. Baby was latching to right breast before engorgement, is latching to left breast. Advised Mom to get into warm shower, massage breast to get milk moving. Pump as needed to soften breast, apply ice packs. When breast is softer then start offering right breast to baby with feedings. Prior to BF to treat engorgement, advised to apply warm compress to breast, massage to get milk moving, BF for 15-20 minutes both breasts with feedings, post pump as needed to soften and for comfort, apply ice packs, every 2-3 hours.  If baby not nursing on both breasts, then pump right breast every 3 hours for 15 minutes to protect milk supply and give baby back any amount of EBM Mom receives from right breast after nursing on left breast. If unable to get baby to latch to right breast or engorgement not improving, then call for OP appointment. If S/S of mastitis develop, call OB. Reviewed S/S of mastitis with Mom.

## 2016-04-30 NOTE — Lactation Note (Signed)
This note was copied from a baby's chart. Lactation Consultation Note Mom has been breast/formula. Mom has changed to formula feeding. States she doesn't like breast feeding. Mom's breast are filling. Educated on filling, engorgement, weaning breast, cabbage, ice, and supportive bra. Mom hopes baby is going home today.  Patient Name: Peggy Hooper ZOXWR'UToday's Date: 04/30/2016 Reason for consult: Follow-up assessment   Maternal Data    Feeding Feeding Type: Formula Nipple Type: Slow - flow  LATCH Score/Interventions                      Lactation Tools Discussed/Used     Consult Status Consult Status: Complete Date: 04/30/16    Charyl DancerCARVER, Shadae Reino G 04/30/2016, 6:01 AM

## 2016-06-04 ENCOUNTER — Encounter: Payer: Self-pay | Admitting: *Deleted

## 2016-12-04 ENCOUNTER — Encounter (HOSPITAL_COMMUNITY): Payer: Self-pay

## 2018-01-16 IMAGING — US US MFM FETAL NUCHAL TRANSLUCENCY
1 series · 15 of 23 positions shown · non-contrast
Comparison: none

[Series 1: us mfm fetal nuchal translucency · 15 of 23 slices shown]
[im 1/23]
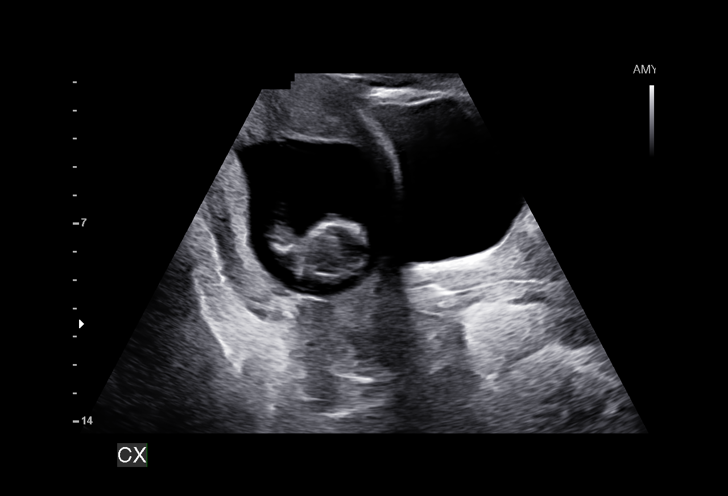
[im 3/23]
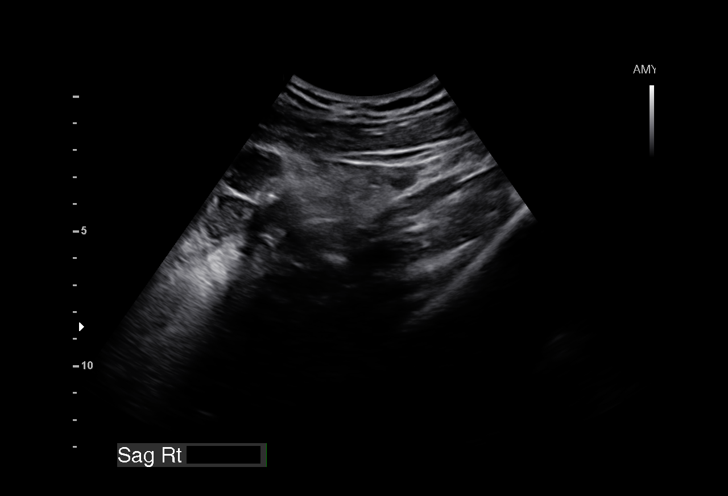
[im 4/23]
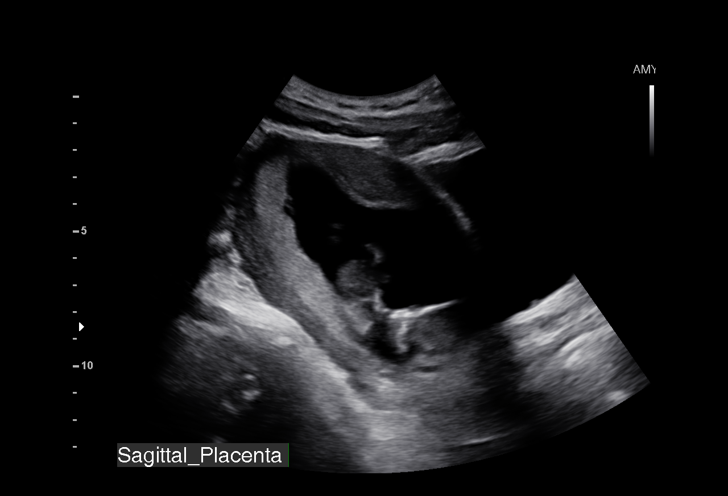
[im 6/23]
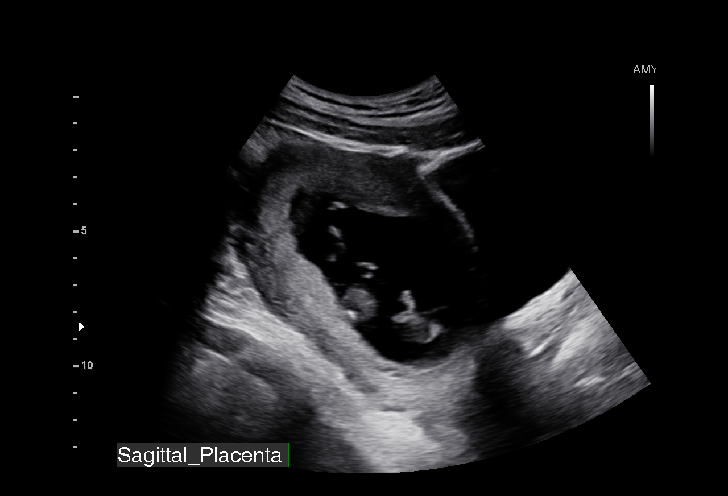
[im 7/23]
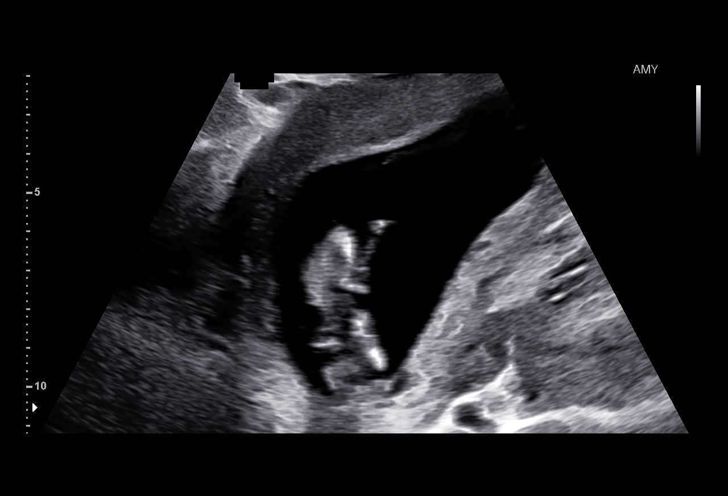
[im 9/23]
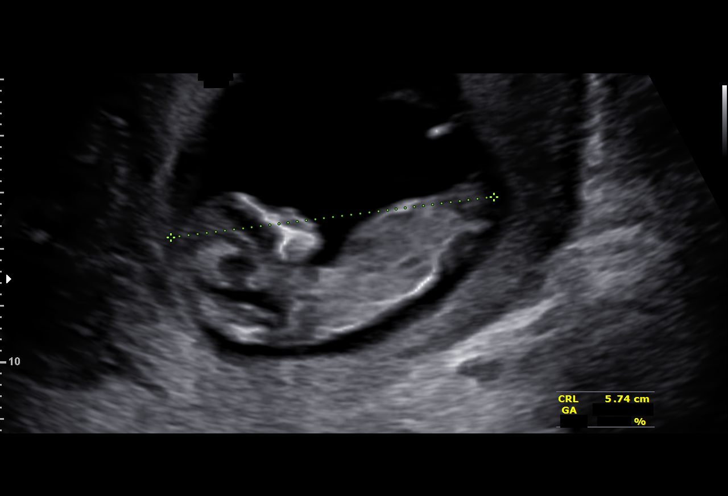
[im 10/23]
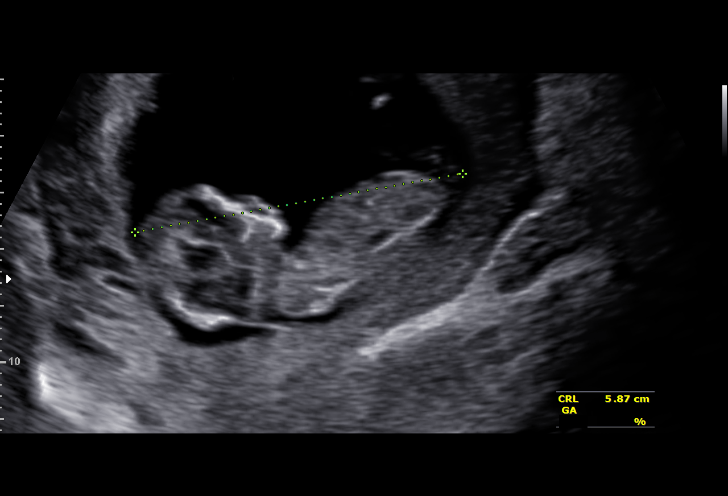
[im 12/23]
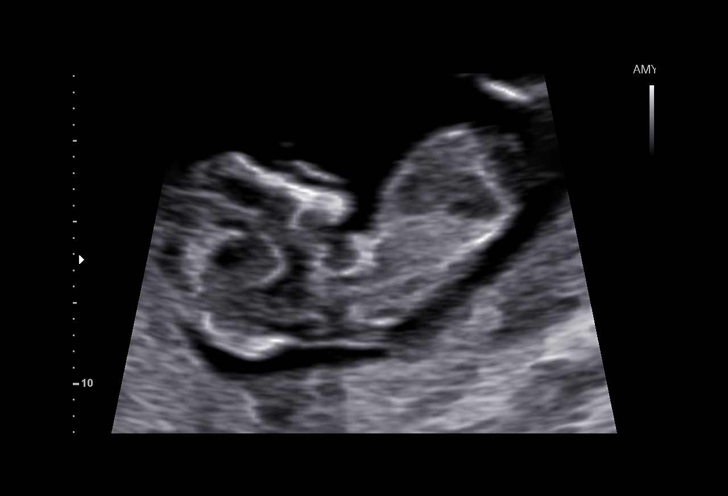
[im 14/23]
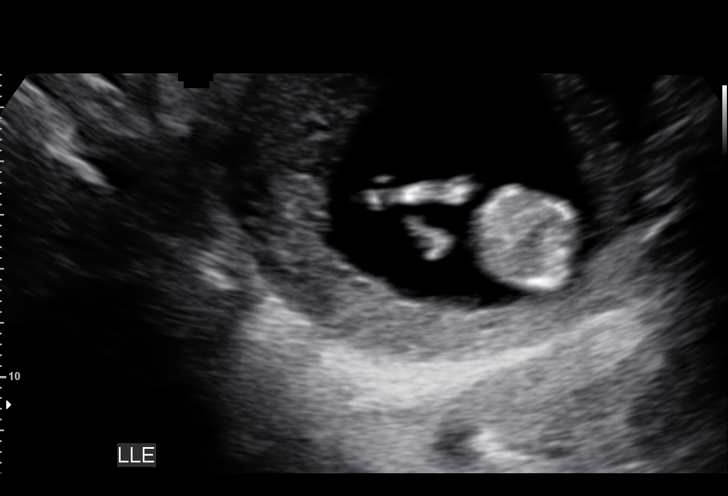
[im 15/23]
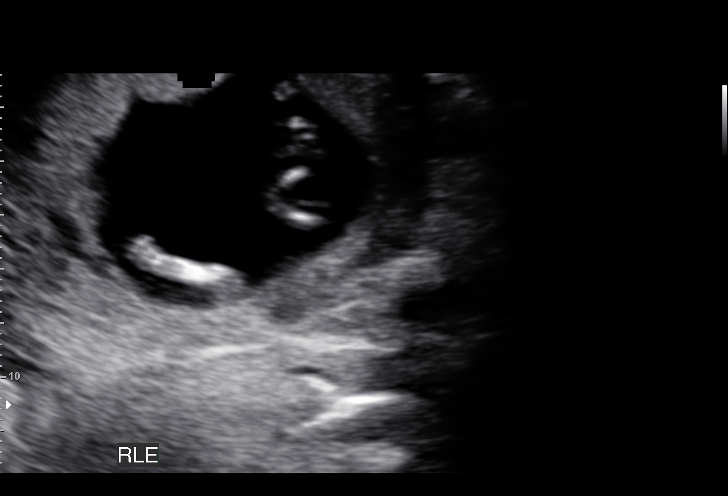
[im 17/23]
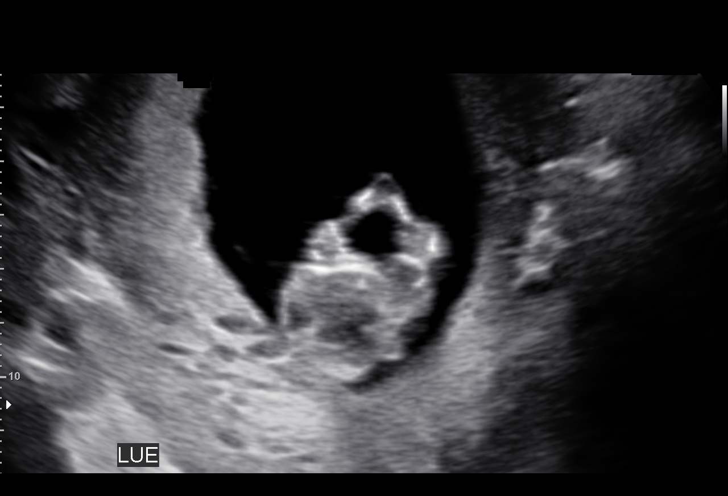
[im 18/23]
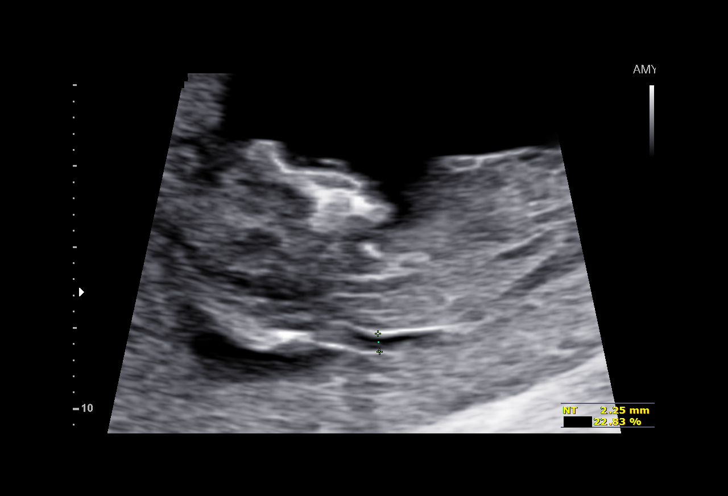
[im 20/23]
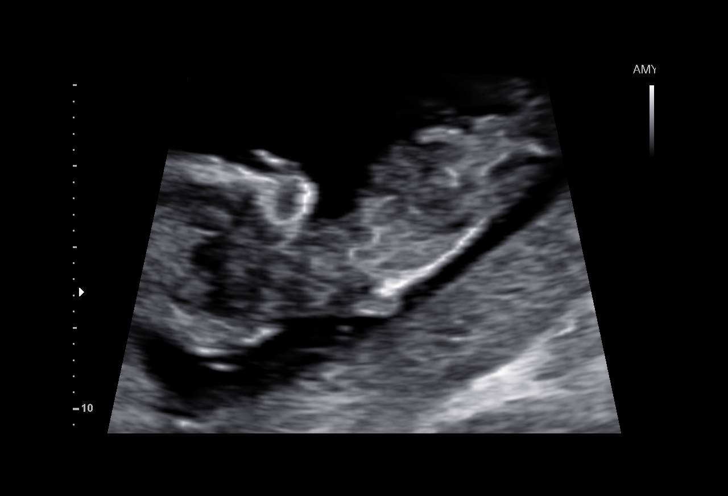
[im 21/23]
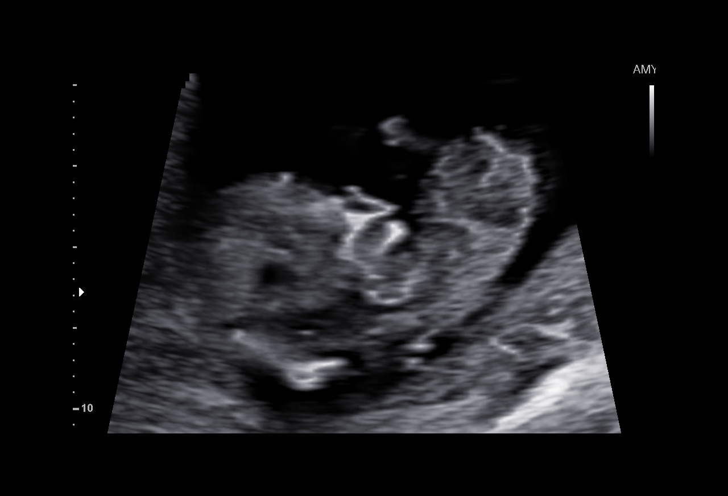
[im 23/23]
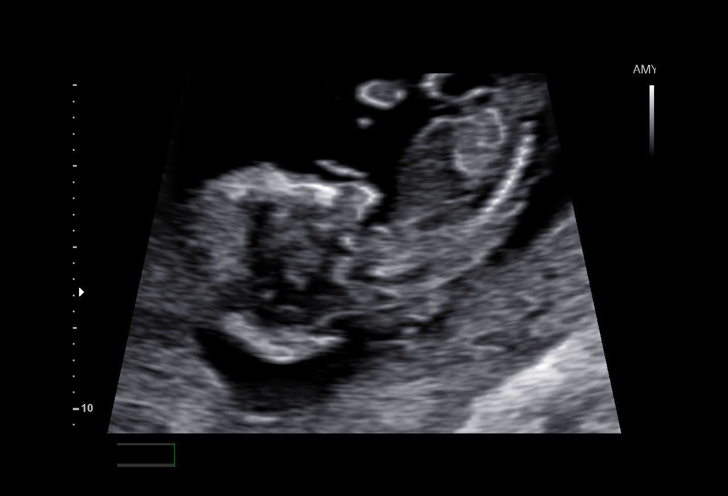

[15 of 23 positions shown; findings below may reference images not displayed]

pm)

Name:       HIRAN HEINRICH                         Visit  10/10/2015 [DATE]
Date:

[REDACTED]-
Faculty Physician

TRANSLUCENCY

1  JG OGANDO            [id number]       [id number]     [id number]
Indications

First trimester aneuploidy screen (NT)          Z36
12 weeks gestation of pregnancy
OB History

Gravidity:     1         Term:  0        Prem:    0        SAB:   0
TOP:           0       Ectopic  0        Living:  0
:
Fetal Evaluation

Num Of Fetuses:      1
Fetal Heart          153
Rate(bpm):
Cardiac Activity:    Observed
Presentation:        Variable
Placenta:            Posterior, above cervical os

Amniotic Fluid
AFI FV:      Subjectively within normal limits
Gestational Age

LMP:           12w 3d        Date:  07/15/15                  EDD:   04/20/16
Best:          12w 3d    Det. By:   LMP  (07/15/15)           EDD:   04/20/16
1st Trimester Genetic Sonogram Screening

CRL:            58.7  mm     G. Age:   12w 2d                 EDD:   04/21/16
Cervix Uterus Adnexa

Cervix
Normal appearance by transabdominal scan. Appears closed,
without funnelling.

Left Ovary
Not visualized. No adnexal mass visualized.

Right Ovary
Not visualized. No adnexal mass visualized.
Impression

SIUP at 12+3 weeks
No gross abnormalities identified
Unable to obtain NT measurement today due to fetal
position
Normal amniotic fluid volume
Measurements consistent with LMP dating
Recommendations

Offer anatomy U/S by 18 weeks
May desire quad screen
See genetic counseling note

## 2018-06-11 ENCOUNTER — Emergency Department (HOSPITAL_COMMUNITY): Admission: EM | Admit: 2018-06-11 | Discharge: 2018-06-11 | Discharge status: Absent without leave | Payer: Self-pay

## 2018-11-24 ENCOUNTER — Other Ambulatory Visit: Payer: Self-pay

## 2018-11-24 ENCOUNTER — Emergency Department (HOSPITAL_COMMUNITY): Payer: Medicaid Other

## 2018-11-24 ENCOUNTER — Emergency Department (HOSPITAL_COMMUNITY)
Admission: EM | Admit: 2018-11-24 | Discharge: 2018-11-24 | Disposition: A | Discharge status: Home / Self Care | Payer: Medicaid Other | Attending: Emergency Medicine | Admitting: Emergency Medicine

## 2018-11-24 ENCOUNTER — Encounter (HOSPITAL_COMMUNITY): Payer: Self-pay | Admitting: Emergency Medicine

## 2018-11-24 DIAGNOSIS — N3000 Acute cystitis without hematuria: Secondary | ICD-10-CM | POA: Insufficient documentation

## 2018-11-24 DIAGNOSIS — R109 Unspecified abdominal pain: Secondary | ICD-10-CM | POA: Diagnosis present

## 2018-11-24 DIAGNOSIS — Z79899 Other long term (current) drug therapy: Secondary | ICD-10-CM | POA: Insufficient documentation

## 2018-11-24 DIAGNOSIS — Z87891 Personal history of nicotine dependence: Secondary | ICD-10-CM | POA: Diagnosis not present

## 2018-11-24 LAB — CBC WITH DIFFERENTIAL/PLATELET
Abs Immature Granulocytes: 0.01 10*3/uL (ref 0.00–0.07)
BASOS ABS: 0 10*3/uL (ref 0.0–0.1)
Basophils Relative: 1 %
Eosinophils Absolute: 0 10*3/uL (ref 0.0–0.5)
Eosinophils Relative: 1 %
HCT: 35.4 % — ABNORMAL LOW (ref 36.0–46.0)
Hemoglobin: 11.9 g/dL — ABNORMAL LOW (ref 12.0–15.0)
Immature Granulocytes: 0 %
Lymphocytes Relative: 52 %
Lymphs Abs: 1.7 10*3/uL (ref 0.7–4.0)
MCH: 32.7 pg (ref 26.0–34.0)
MCHC: 33.6 g/dL (ref 30.0–36.0)
MCV: 97.3 fL (ref 80.0–100.0)
Monocytes Absolute: 0.2 10*3/uL (ref 0.1–1.0)
Monocytes Relative: 7 %
Neutro Abs: 1.3 10*3/uL — ABNORMAL LOW (ref 1.7–7.7)
Neutrophils Relative %: 39 %
Platelets: 189 10*3/uL (ref 150–400)
RBC: 3.64 MIL/uL — ABNORMAL LOW (ref 3.87–5.11)
RDW: 12 % (ref 11.5–15.5)
WBC: 3.3 10*3/uL — ABNORMAL LOW (ref 4.0–10.5)
nRBC: 0 % (ref 0.0–0.2)

## 2018-11-24 LAB — COMPREHENSIVE METABOLIC PANEL
ALBUMIN: 3.7 g/dL (ref 3.5–5.0)
ALT: 16 U/L (ref 0–44)
AST: 16 U/L (ref 15–41)
Alkaline Phosphatase: 47 U/L (ref 38–126)
Anion gap: 5 (ref 5–15)
BUN: 13 mg/dL (ref 6–20)
CO2: 28 mmol/L (ref 22–32)
Calcium: 9.1 mg/dL (ref 8.9–10.3)
Chloride: 104 mmol/L (ref 98–111)
Creatinine, Ser: 0.68 mg/dL (ref 0.44–1.00)
GFR calc Af Amer: >60 mL/min (ref >60)
GFR calc non Af Amer: >60 mL/min (ref >60)
GLUCOSE: 102 mg/dL — AB (ref 70–99)
Potassium: 3.9 mmol/L (ref 3.5–5.1)
Sodium: 137 mmol/L (ref 135–145)
Total Bilirubin: 0.6 mg/dL (ref 0.3–1.2)
Total Protein: 6.4 g/dL — ABNORMAL LOW (ref 6.5–8.1)

## 2018-11-24 LAB — URINALYSIS, ROUTINE W REFLEX MICROSCOPIC
BILIRUBIN URINE: NEGATIVE
Glucose, UA: NEGATIVE mg/dL
Hgb urine dipstick: NEGATIVE
KETONES UR: NEGATIVE mg/dL
Nitrite: NEGATIVE
Protein, ur: NEGATIVE mg/dL
Specific Gravity, Urine: 1.018 (ref 1.005–1.030)
pH: 6 (ref 5.0–8.0)

## 2018-11-24 LAB — LIPASE, BLOOD: Lipase: 28 U/L (ref 11–51)

## 2018-11-24 LAB — I-STAT BETA HCG BLOOD, ED (MC, WL, AP ONLY): I-stat hCG, quantitative: <5 m[IU]/mL (ref <5)

## 2018-11-24 MED ORDER — CEPHALEXIN 250 MG PO CAPS
500.0000 mg | ORAL_CAPSULE | Freq: Once | ORAL | Status: AC
  Administered 2018-11-24: 500 mg via ORAL
  Filled 2018-11-24: qty 2

## 2018-11-24 MED ORDER — CEPHALEXIN 500 MG PO CAPS: 500.0000 mg | ORAL_CAPSULE | Freq: Three times a day (TID) | ORAL | 0 refills | Status: DC

## 2018-11-24 MED ORDER — KETOROLAC TROMETHAMINE 30 MG/ML IJ SOLN
30.0000 mg | Freq: Once | INTRAMUSCULAR | Status: AC
  Administered 2018-11-24: 30 mg via INTRAVENOUS
  Filled 2018-11-24: qty 1

## 2018-11-24 MED ORDER — IOHEXOL 300 MG/ML  SOLN
100.0000 mL | Freq: Once | INTRAMUSCULAR | Status: AC | PRN
  Administered 2018-11-24: 100 mL via INTRAVENOUS

## 2018-11-24 MED ORDER — SODIUM CHLORIDE 0.9 % IV BOLUS
1000.0000 mL | Freq: Once | INTRAVENOUS | Status: AC
  Administered 2018-11-24: 1000 mL via INTRAVENOUS

## 2018-11-24 NOTE — Discharge Instructions (Signed)
Take keflex three times daily for a week   Take tylenol, motrin for pain.   Stay hydrated.   See your doctor  Return to ER if you have worse abdominal pain, flank pain, vomiting, fever

## 2018-11-24 NOTE — ED Triage Notes (Signed)
Pt reports lower back and abd pain that started 1 month ago. Pt reports an upcoming PCP appt on 4/6 and states she could not wait that long.

## 2018-11-24 NOTE — ED Provider Notes (Signed)
[facility] EMERGENCY DEPARTMENT Provider Note   CSN: [id number] Arrival date & time: 11/24/18  1031    History   Chief Complaint Chief Complaint  Patient presents with   Abdominal Pain   Back Pain    HPI Peggy Hooper is a 28 y.o. female history of depression here presenting with right lower quadrant pain, right flank pain.  Patient has been having intermittent right lower quadrant and right flank pain for the last 3 weeks or so.  Patient denies any urinary symptoms.  Patient states that the pain progressively got worse over the last week or so and she is appointment with her doctor in 2 weeks but wanted to get checked out.  Denies any nausea or vomiting.  Patient denies any vaginal bleeding or spotting or fevers.  Denies any previous abdominal surgeries except tubal ligation.     The history is provided by the patient.    Past Medical History:  Diagnosis Date   Medical history non-contributory    Pregnant     Patient Active Problem List   Diagnosis Date Noted   NSVD (normal spontaneous vaginal delivery) 04/29/2016   Encounter for sterilization 04/28/2016   Post-dates pregnancy 04/27/2016   MDD (major depressive disorder), recurrent episode, severe (HCC) 04/25/2015   Suicide attempt by drug ingestion (HCC) 04/25/2015   Normocytic anemia 04/25/2015   Hypokalemia 04/25/2015    Past Surgical History:  Procedure Laterality Date   NO PAST SURGERIES     TUBAL LIGATION Bilateral 04/28/2016   Procedure: BILATERAL TUBAL LIGATION;  Surgeon: Tilda Burrow, MD;  Location: [facility];  Service: Gynecology;  Laterality: Bilateral;     OB History    Gravida  1   Para  1   Term  1   Preterm      AB      Living  1     SAB      TAB      Ectopic      Multiple  0   Live Births  1            Home Medications    Prior to Admission medications   Medication Sig Start Date End Date Taking? Authorizing Provider  cephALEXin (KEFLEX)  500 MG capsule Take 1 capsule (500 mg total) by mouth 3 (three) times daily. 11/24/18   Charlynne Pander, MD  ibuprofen (ADVIL,MOTRIN) 600 MG tablet Take 1 tablet (600 mg total) by mouth every 6 (six) hours. 04/29/16   Leland Her, DO  Prenatal Vit-Fe Fumarate-FA (PRENATAL COMPLETE) 14-0.4 MG TABS Take 1 tablet by mouth 2 (two) times daily. 08/20/15   Neva Seat, Tiffany, PA-C  senna-docusate (SENOKOT-S) 8.6-50 MG tablet Take 2 tablets by mouth daily. 04/29/16   Leland Her, DO    Family History Family History  Problem Relation Age of Onset   Diabetes Mother     Social History Social History   Tobacco Use   Smoking status: Former Smoker   Smokeless tobacco: Never Used  Substance Use Topics   Alcohol use: No   Drug use: No     Allergies   Patient has no known allergies.   Review of Systems Review of Systems  Gastrointestinal: Positive for abdominal pain.  Musculoskeletal: Positive for back pain.  All other systems reviewed and are negative.    Physical Exam Updated Vital Signs BP 124/75 (BP Location: Right Arm)    Pulse 72    Temp 98.2 F (36.8 C) (Oral)  Resp 16    Ht  (1.727 m)    Wt 107 kg    SpO2 100%    BMI 35.87 kg/m   Physical Exam Vitals signs and nursing note reviewed.  Constitutional:      Appearance: She is well-developed.  HENT:     Head: Normocephalic.     Mouth/Throat:     Mouth: Mucous membranes are moist.  Eyes:     Extraocular Movements: Extraocular movements intact.  Cardiovascular:     Rate and Rhythm: Normal rate and regular rhythm.  Pulmonary:     Effort: Pulmonary effort is normal.     Breath sounds: Normal breath sounds.  Abdominal:     General: Abdomen is flat. Bowel sounds are normal.     Palpations: Abdomen is soft.     Comments: Mild RLQ and R CVAT   Skin:    General: Skin is warm.     Capillary Refill: Capillary refill takes less than 2 seconds.  Neurological:     General: No focal deficit present.     Mental  Status: She is alert and oriented to person, place, and time.  Psychiatric:        Mood and Affect: Mood normal.        Behavior: Behavior normal.      ED Treatments / Results  Labs (all labs ordered are listed, but only abnormal results are displayed) Labs Reviewed  CBC WITH DIFFERENTIAL/PLATELET - Abnormal; Notable for the following components:      Result Value   WBC 3.3 (*)    RBC 3.64 (*)    Hemoglobin 11.9 (*)    HCT 35.4 (*)    Neutro Abs 1.3 (*)    All other components within normal limits  COMPREHENSIVE METABOLIC PANEL - Abnormal; Notable for the following components:   Glucose, Bld 102 (*)    Total Protein 6.4 (*)    All other components within normal limits  URINALYSIS, ROUTINE W REFLEX MICROSCOPIC - Abnormal; Notable for the following components:   APPearance CLOUDY (*)    Leukocytes,Ua SMALL (*)    Bacteria, UA RARE (*)    All other components within normal limits  LIPASE, BLOOD  I-STAT BETA HCG BLOOD, ED ([facility], [facility], [facility] ONLY)    EKG None  Radiology Ct Abdomen Pelvis W Contrast  Result Date: 11/24/2018 CLINICAL DATA:  Low back pain and abdominal pain 1 month. EXAM: CT ABDOMEN AND PELVIS WITH CONTRAST TECHNIQUE: Multidetector CT imaging of the abdomen and pelvis was performed using the standard protocol following bolus administration of intravenous contrast. CONTRAST:  OMNIPAQUE IOHEXOL 300 MG/ML  SOLN COMPARISON:  None. FINDINGS: Lower chest: Lung bases are normal. Hepatobiliary: Liver, gallbladder and biliary tree are normal. Pancreas: Normal. Spleen: Normal. Adrenals/Urinary Tract: Adrenal glands are normal. Kidneys are normal in size without hydronephrosis or nephrolithiasis. Ureters and bladder are normal. Stomach/Bowel: Stomach and small bowel are normal. Appendix is normal. Colon is normal. Vascular/Lymphatic: Normal. Reproductive: Evidence of previous bilateral tubal ligation. Other: No significant free fluid or focal inflammatory change. Musculoskeletal:  Unremarkable. IMPRESSION: No acute findings in the abdomen/pelvis. Electronically Signed   By: Elberta Fortis M.D.   On: 11/24/2018 14:19    Procedures Procedures (including critical care time)  Medications Ordered in ED Medications  ketorolac (TORADOL) 30 MG/ML injection 30 mg (30 mg Intravenous Given 11/24/18 1302)  sodium chloride 0.9 % bolus 1,000 mL (0 mLs Intravenous Stopped 11/24/18 1427)  iohexol (OMNIPAQUE) 300 MG/ML solution 100  mL (100 mLs Intravenous Contrast Given 11/24/18 1359)  cephALEXin (KEFLEX) capsule 500 mg (500 mg Oral Given 11/24/18 1433)     Initial Impression / Assessment and Plan / ED Course  I have reviewed the triage vital signs and the nursing notes.  Pertinent labs & imaging results that were available during my care of the patient were reviewed by me and considered in my medical decision making (see chart for details).       Cathie Kozal is a 28 y.o. female here with R sided abdominal pain. Likely pyelo vs MSK. Low suspicion for appendicitis or torsion as pain has been going on for 3 weeks. Will get labs, UA, CT ab/pel.   2:30 pm Labs unremarkable. UA ? UTI. CT unremarkable. Likely UTI vs early pyelo. Doesn't appear septic. Will dc home with course of keflex.    Final Clinical Impressions(s) / ED Diagnoses   Final diagnoses:  Acute cystitis without hematuria    ED Discharge Orders         Ordered    cephALEXin (KEFLEX) 500 MG capsule  3 times daily     11/24/18 1426           Charlynne Pander, MD 11/24/18 1540

## 2018-11-24 NOTE — ED Notes (Signed)
Patient verbalizes understanding of discharge instructions . Opportunity for questions and answers were provided . Armband removed by staff ,Pt discharged from ED. W/C  offered at D/C  and Declined W/C at D/C and was escorted to lobby by RN.  

## 2019-04-23 ENCOUNTER — Encounter (HOSPITAL_COMMUNITY): Payer: Self-pay

## 2019-04-23 ENCOUNTER — Other Ambulatory Visit: Payer: Self-pay

## 2019-04-23 ENCOUNTER — Emergency Department (HOSPITAL_COMMUNITY)
Admission: EM | Admit: 2019-04-23 | Discharge: 2019-04-23 | Disposition: A | Discharge status: Home / Self Care | Payer: Medicaid Other | Attending: Emergency Medicine | Admitting: Emergency Medicine

## 2019-04-23 DIAGNOSIS — M79644 Pain in right finger(s): Secondary | ICD-10-CM | POA: Diagnosis present

## 2019-04-23 DIAGNOSIS — R2231 Localized swelling, mass and lump, right upper limb: Secondary | ICD-10-CM | POA: Diagnosis not present

## 2019-04-23 DIAGNOSIS — Z87891 Personal history of nicotine dependence: Secondary | ICD-10-CM | POA: Diagnosis not present

## 2019-04-23 DIAGNOSIS — L03011 Cellulitis of right finger: Secondary | ICD-10-CM | POA: Diagnosis not present

## 2019-04-23 NOTE — ED Triage Notes (Signed)
Right middle finger swelling around the base of the nail for about a week and a half now painful and swollen states started after getting fake nails applied.

## 2019-04-23 NOTE — Discharge Instructions (Addendum)
Please read instructions below.  Soak/flush your finger with warm water, multiple times per day. You can take Advil/ibuprofen every 6 hours as needed for pain. This may come to a head and need drainage in a few days. Return to the ER for fever, worsening redness, or new or worsening symptoms.

## 2019-04-23 NOTE — ED Provider Notes (Signed)
[facility]-EMERGENCY DEPT Provider Note   CSN: [id number] Arrival date & time: 04/23/19  1450     History   Chief Complaint Chief Complaint  Patient presents with  . Hand Pain    HPI Peggy Hooper is a 28 y.o. female without significant past medical history, presenting to the emergency department with complaint of pain and swelling to her right middle finger.  She states she had acrylic nails applied and states it was causing pain to her cuticle and therefore she had them removed.  She states she began having more swelling and pain to the cuticle.  She has had no drainage.  She states she has tried to stick a needle in it however had no drainage expressed.  She has taken Tylenol for symptoms.  No fevers.  No history of diabetes or immunocompromise.     The history is provided by the patient.    Past Medical History:  Diagnosis Date  . Medical history non-contributory   . Pregnant     Patient Active Problem List   Diagnosis Date Noted  . NSVD (normal spontaneous vaginal delivery) 04/29/2016  . Encounter for sterilization 04/28/2016  . Post-dates pregnancy 04/27/2016  . MDD (major depressive disorder), recurrent episode, severe (HCC) 04/25/2015  . Suicide attempt by drug ingestion (HCC) 04/25/2015  . Normocytic anemia 04/25/2015  . Hypokalemia 04/25/2015    Past Surgical History:  Procedure Laterality Date  . NO PAST SURGERIES    . TUBAL LIGATION Bilateral 04/28/2016   Procedure: BILATERAL TUBAL LIGATION;  Surgeon: Tilda BurrowJohn V Ferguson, MD;  Location: [facility];  Service: Gynecology;  Laterality: Bilateral;     OB History    Gravida  1   Para  1   Term  1   Preterm      AB      Living  1     SAB      TAB      Ectopic      Multiple  0   Live Births  1            Home Medications    Prior to Admission medications   Medication Sig Start Date End Date Taking? Authorizing Provider  cephALEXin (KEFLEX) 500 MG capsule Take 1 capsule  (500 mg total) by mouth 3 (three) times daily. 11/24/18   Charlynne PanderYao, David Hsienta, MD  ibuprofen (ADVIL,MOTRIN) 600 MG tablet Take 1 tablet (600 mg total) by mouth every 6 (six) hours. 04/29/16   Leland HerYoo, Elsia J, DO  Prenatal Vit-Fe Fumarate-FA (PRENATAL COMPLETE) 14-0.4 MG TABS Take 1 tablet by mouth 2 (two) times daily. 08/20/15   Neva SeatGreene, Tiffany, PA-C  senna-docusate (SENOKOT-S) 8.6-50 MG tablet Take 2 tablets by mouth daily. 04/29/16   Leland HerYoo, Elsia J, DO    Family History Family History  Problem Relation Age of Onset  . Diabetes Mother     Social History Social History   Tobacco Use  . Smoking status: Former Games developermoker  . Smokeless tobacco: Never Used  Substance Use Topics  . Alcohol use: No  . Drug use: No     Allergies   Patient has no known allergies.   Review of Systems Review of Systems  All other systems reviewed and are negative.    Physical Exam Updated Vital Signs BP 139/76 (BP Location: Left Arm)   Pulse 67   Temp 98 F (36.7 C) (Oral)   Resp 18   Ht 5\' 7"  (1.702 m)   Wt 86.2 kg  SpO2 100%   BMI 29.76 kg/m   Physical Exam Vitals signs and nursing note reviewed.  Constitutional:      General: She is not in acute distress.    Appearance: She is well-developed.  HENT:     Head: Normocephalic and atraumatic.  Eyes:     Conjunctiva/sclera: Conjunctivae normal.  Cardiovascular:     Rate and Rhythm: Normal rate.  Pulmonary:     Effort: Pulmonary effort is normal.  Skin:    Comments: The nail fold of the right third digit has some localized swelling and tenderness.  There is no large redness.  There is no fluctuance.  There is no drainage.  The pad of the distal phalanx is soft.  Neurological:     Mental Status: She is alert.  Psychiatric:        Mood and Affect: Mood normal.        Behavior: Behavior normal.      ED Treatments / Results  Labs (all labs ordered are listed, but only abnormal results are displayed) Labs Reviewed - No data to display   EKG None  Radiology No results found.  Procedures Procedures (including critical care time)  Medications Ordered in ED Medications - No data to display   Initial Impression / Assessment and Plan / ED Course  I have reviewed the triage vital signs and the nursing notes.  Pertinent labs & imaging results that were available during my care of the patient were reviewed by me and considered in my medical decision making (see chart for details).        Patient with paronychia to the right third digit after having acrylic nails.  On exam today, it does not appear that it is ready to be drained, there is no fluctuance.  There are no signs of surrounding cellulitis.  No evidence of a felon.  Recommend warm compresses, NSAIDs, return as needed.  Discussed results, findings, treatment and follow up. Patient advised of return precautions. Patient verbalized understanding and agreed with plan.   Final Clinical Impressions(s) / ED Diagnoses   Final diagnoses:  Paronychia of right middle finger    ED Discharge Orders    None       Robinson, [place] N, PA-C 04/23/19 1623    Milton Ferguson, MD 04/27/19 1318

## 2019-07-05 ENCOUNTER — Other Ambulatory Visit: Payer: Self-pay

## 2019-07-05 ENCOUNTER — Encounter (HOSPITAL_COMMUNITY): Payer: Self-pay | Admitting: Emergency Medicine

## 2019-07-05 ENCOUNTER — Emergency Department (HOSPITAL_COMMUNITY)
Admission: EM | Admit: 2019-07-05 | Discharge: 2019-07-05 | Disposition: A | Discharge status: Home / Self Care | Payer: Medicaid Other | Attending: Emergency Medicine | Admitting: Emergency Medicine

## 2019-07-05 DIAGNOSIS — Z5321 Procedure and treatment not carried out due to patient leaving prior to being seen by health care provider: Secondary | ICD-10-CM | POA: Diagnosis not present

## 2019-07-05 DIAGNOSIS — H9202 Otalgia, left ear: Secondary | ICD-10-CM | POA: Diagnosis present

## 2019-07-05 NOTE — ED Triage Notes (Signed)
Patient complaining of left ear pain that started two hours ago. Patient is not complaining of anything else.

## 2019-07-10 ENCOUNTER — Emergency Department (HOSPITAL_COMMUNITY): Payer: Medicaid Other

## 2019-07-10 ENCOUNTER — Other Ambulatory Visit: Payer: Self-pay

## 2019-07-10 ENCOUNTER — Emergency Department (HOSPITAL_COMMUNITY)
Admission: EM | Admit: 2019-07-10 | Discharge: 2019-07-10 | Disposition: A | Discharge status: Home / Self Care | Payer: Medicaid Other | Attending: Emergency Medicine | Admitting: Emergency Medicine

## 2019-07-10 DIAGNOSIS — H9202 Otalgia, left ear: Secondary | ICD-10-CM

## 2019-07-10 DIAGNOSIS — H60502 Unspecified acute noninfective otitis externa, left ear: Secondary | ICD-10-CM | POA: Diagnosis not present

## 2019-07-10 DIAGNOSIS — Z79899 Other long term (current) drug therapy: Secondary | ICD-10-CM | POA: Diagnosis not present

## 2019-07-10 DIAGNOSIS — Z87891 Personal history of nicotine dependence: Secondary | ICD-10-CM | POA: Insufficient documentation

## 2019-07-10 LAB — BASIC METABOLIC PANEL
Anion gap: 8 (ref 5–15)
BUN: 9 mg/dL (ref 6–20)
CO2: 22 mmol/L (ref 22–32)
Calcium: 8.9 mg/dL (ref 8.9–10.3)
Chloride: 107 mmol/L (ref 98–111)
Creatinine, Ser: 0.73 mg/dL (ref 0.44–1.00)
GFR calc Af Amer: >60 mL/min (ref >60)
GFR calc non Af Amer: >60 mL/min (ref >60)
Glucose, Bld: 85 mg/dL (ref 70–99)
Potassium: 3.7 mmol/L (ref 3.5–5.1)
Sodium: 137 mmol/L (ref 135–145)

## 2019-07-10 LAB — CBC WITH DIFFERENTIAL/PLATELET
Abs Immature Granulocytes: 0.02 10*3/uL (ref 0.00–0.07)
Basophils Absolute: 0 10*3/uL (ref 0.0–0.1)
Basophils Relative: 1 %
Eosinophils Absolute: 0 10*3/uL (ref 0.0–0.5)
Eosinophils Relative: 0 %
HCT: 37.8 % (ref 36.0–46.0)
Hemoglobin: 12.7 g/dL (ref 12.0–15.0)
Immature Granulocytes: 0 %
Lymphocytes Relative: 26 %
Lymphs Abs: 1.7 10*3/uL (ref 0.7–4.0)
MCH: 33 pg (ref 26.0–34.0)
MCHC: 33.6 g/dL (ref 30.0–36.0)
MCV: 98.2 fL (ref 80.0–100.0)
Monocytes Absolute: 0.4 10*3/uL (ref 0.1–1.0)
Monocytes Relative: 6 %
Neutro Abs: 4.3 10*3/uL (ref 1.7–7.7)
Neutrophils Relative %: 67 %
Platelets: 178 10*3/uL (ref 150–400)
RBC: 3.85 MIL/uL — ABNORMAL LOW (ref 3.87–5.11)
RDW: 11.8 % (ref 11.5–15.5)
WBC: 6.5 10*3/uL (ref 4.0–10.5)
nRBC: 0 % (ref 0.0–0.2)

## 2019-07-10 LAB — I-STAT BETA HCG BLOOD, ED (MC, WL, AP ONLY): I-stat hCG, quantitative: <5 m[IU]/mL (ref <5)

## 2019-07-10 MED ORDER — HYDROMORPHONE HCL 1 MG/ML IJ SOLN
0.5000 mg | Freq: Once | INTRAMUSCULAR | Status: AC
  Administered 2019-07-10: 0.5 mg via INTRAVENOUS
  Filled 2019-07-10: qty 1

## 2019-07-10 MED ORDER — IOHEXOL 300 MG/ML  SOLN
75.0000 mL | Freq: Once | INTRAMUSCULAR | Status: AC | PRN
  Administered 2019-07-10: 18:00:00 75 mL via INTRAVENOUS

## 2019-07-10 MED ORDER — VANCOMYCIN HCL IN DEXTROSE 1-5 GM/200ML-% IV SOLN: 1000.0000 mg | Freq: Three times a day (TID) | INTRAVENOUS | Status: DC

## 2019-07-10 MED ORDER — KETOROLAC TROMETHAMINE 30 MG/ML IJ SOLN
30.0000 mg | Freq: Once | INTRAMUSCULAR | Status: AC
  Administered 2019-07-10: 30 mg via INTRAVENOUS
  Filled 2019-07-10: qty 1

## 2019-07-10 MED ORDER — HYDROMORPHONE HCL 1 MG/ML IJ SOLN: 0.5000 mg | Freq: Once | INTRAMUSCULAR | Status: DC

## 2019-07-10 MED ORDER — VANCOMYCIN HCL 10 G IV SOLR
1750.0000 mg | Freq: Once | INTRAVENOUS | Status: AC
  Administered 2019-07-10: 1750 mg via INTRAVENOUS
  Filled 2019-07-10: qty 1750

## 2019-07-10 MED ORDER — LEVOFLOXACIN 500 MG PO TABS: 500.0000 mg | ORAL_TABLET | Freq: Every day | ORAL | 0 refills | Status: AC

## 2019-07-10 MED ORDER — SODIUM CHLORIDE 0.9 % IV SOLN
2.0000 g | Freq: Once | INTRAVENOUS | Status: AC
  Administered 2019-07-10: 2 g via INTRAVENOUS
  Filled 2019-07-10: qty 2

## 2019-07-10 MED ORDER — HYDROCODONE-ACETAMINOPHEN 5-325 MG PO TABS: 1.0000 | ORAL_TABLET | ORAL | 0 refills | Status: DC | PRN

## 2019-07-10 MED ORDER — SODIUM CHLORIDE 0.9 % IV SOLN: 2.0000 g | Freq: Three times a day (TID) | INTRAVENOUS | Status: DC

## 2019-07-10 MED ORDER — CIPROFLOXACIN-DEXAMETHASONE 0.3-0.1 % OT SUSP
4.0000 [drp] | Freq: Once | OTIC | Status: AC
  Administered 2019-07-10: 4 [drp] via OTIC
  Filled 2019-07-10: qty 7.5

## 2019-07-10 NOTE — ED Provider Notes (Signed)
Care assumed from [facility] at shift change. See note for full HPI.  Summation 28 year old female who presents for evaluation of worsening left ear pain for 2 weeks.  She was started on oral amoxicillin.  She has had worsening fever and pain to her left ear.  She proceeded to have fever and pain and proceeded to be reevaluated by urgent care and was switched to a different antibiotic as well as eardrops.  Plan to obtain CT temporal bones to rule out mastoiditis.  If negative may switch to Levaquin 750, place ear wick and have close follow-up with ENT.  Positive for mastoiditis patient will need to be admitted for IV antibiotics. Physical Exam  BP 106/64 (BP Location: Left Arm)   Pulse 75   Temp 99 F (37.2 C) (Oral)   Resp 16   LMP 06/24/2019   SpO2 100%   Physical Exam Vitals signs and nursing note reviewed.  Constitutional:      General: She is not in acute distress.    Appearance: She is well-developed. She is not ill-appearing or toxic-appearing.  HENT:     Head: Atraumatic.     Ears:     Comments: Left tenderness over mastoid.  Tenderness with retraction of pain on palpation of tragus of left ear.  White discharge to external canal with canal edema to left. Right ear without dc or pain with palpation of tragus and pinna. No right mastoid tenderness.    Mouth/Throat:     Mouth: Mucous membranes are dry.  Eyes:     Pupils: Pupils are equal, round, and reactive to light.  Neck:     Musculoskeletal: Full passive range of motion without pain, normal range of motion and neck supple.  Cardiovascular:     Rate and Rhythm: Normal rate.  Pulmonary:     Effort: Pulmonary effort is normal. No respiratory distress.     Breath sounds: Normal breath sounds.  Abdominal:     General: Bowel sounds are normal. There is no distension.  Musculoskeletal: Normal range of motion.  Skin:    General: Skin is warm and dry.  Neurological:     Mental Status: She is alert.     Comments: CN 2-12 grossly  intact.    ED Course/Procedures     Procedures Labs Reviewed  CBC WITH DIFFERENTIAL/PLATELET - Abnormal; Notable for the following components:      Result Value   RBC 3.85 (*)    All other components within normal limits  BASIC METABOLIC PANEL  I-STAT BETA HCG BLOOD, ED ([facility], [facility], [facility] ONLY)  Ct Temporal Bones W Contrast  Result Date: 07/10/2019 CLINICAL DATA:  Left-sided earache. Mastoiditis. EXAM: CT TEMPORAL BONES WITH CONTRAST TECHNIQUE: Axial and coronal plane CT imaging of the petrous temporal bones was performed with thin-collimation image reconstruction after intravenous contrast administration. Multiplanar CT image reconstructions were also generated. CONTRAST:  72mL OMNIPAQUE IOHEXOL 300 MG/ML  SOLN COMPARISON:  None. FINDINGS: Imaged portions the brain are within normal limits. A polyp or mucous retention cyst is present in the right maxillary sinus posteriorly. There is mild mucosal thickening within the maxillary sinuses bilaterally. Mild mucosal thickening is present in the right sphenoid sinus. The remaining paranasal sinuses are clear. The right external auditory canal is within normal limits. The tympanic membrane is visualized and appears to be intact. The middle ear ossicles are normally formed and articulating. There is minimal debris within Prussak's space. The scutum is intact. Epitympanum unremarkable. The mastoid air cells are clear.  Oval window is patent. The inner ear structures are normally formed. The superior semicircular canal is covered. The internal auditory canal and vestibular aqueduct are within normal limits. There is soft tissue prominence within the left external auditory canal. The left tympanic membrane is retracted. There is soft tissue surrounding the malleus and within Prussak's space. The scutum is intact. Middle ear ossicles are normally formed. Stapes is intact. The oval window is patent. There is fluid or soft tissue medial to the ossicles. The inner ear  structures are normally formed. The superior and lateral semicircular canals are covered. The internal auditory canal and vestibular aqueduct are within normal limits. Left mastoid effusion is present. There is no osseous destruction or coalescence. IMPRESSION: 1. Left mastoid effusion without osseous destruction. 2. Fluid or soft tissue within the left middle ear cavity surrounding the middle ear ossicles. 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 3. Retraction of left tympanic membrane.  There may be perforation. 4. Fluid or soft tissue surrounds the stapes and oval window is well. 5. Mild soft tissue thickening within the left external auditory canal without osseous destruction. 6. Minimal fluid or soft tissue in Prussak's space on the right without osseous destruction is concerning for early acquired cholesteatoma. Electronically Signed   By: Marin Roberts M.D.   On: 07/10/2019 20:33   MDM  Plan to follow up on Ct Temporal bones to rule out Mastoiditis.  1950: Spoke with tech at [facility] radiology with delay in reading of CT images given it has been 2 hours.  Patient states radiologist said some of the images need to be reperformed and they have not called over to the imaging center yet it will be read when images have been crossed over.  2025: Patient to CT for additional images, per Radiology request.  CT show possible acquired cholesteatoma and middle ear swelling. No bone destruction or mastoiditis. Will plan to consult ENT.  Consulted with Dr. Leta Baptist with ENT who recommends abx change, Ciprodex and ear wick. Patient to follow up in office next week.  Ear wick placed.  Antibiotic drops given.  Per pharmacy recommendation antibiotics changed to Levaquin.  We will have her follow-up in office with ENT.  Discussed plan with patient.  She is agreeable.  Her pain has been controlled in the ED.  The patient has been appropriately medically screened and/or stabilized in the ED. I  have low suspicion for any other emergent medical condition which would require further screening, evaluation or treatment in the ED or require inpatient management.  Patient is hemodynamically stable and in no acute distress.  Patient able to ambulate in department prior to ED.  Evaluation does not show acute pathology that would require ongoing or additional emergent interventions while in the emergency department or further inpatient treatment.  I have discussed the diagnosis with the patient and answered all questions.  Pain is been managed while in the emergency department and patient has no further complaints prior to discharge.  Patient is comfortable with plan discussed in room and is stable for discharge at this time.  I have discussed strict return precautions for returning to the emergency department.  Patient was encouraged to follow-up with PCP/specialist refer to at discharge.       Henderly, Britni A, PA-C 07/10/19 2146    Bethann Berkshire, MD 07/11/19 1256

## 2019-07-10 NOTE — Progress Notes (Signed)
Pharmacy Antibiotic Note  Peggy Hooper is a 28 y.o. female admitted on 07/10/2019 with mastoiditis.  Pharmacy has been consulted for vancomycin and cefepime dosing.  Presenting with progressively worsening L ear pain for 2 weeks - was started on amoxicillin on 10/26, developed fever and worsening pain and swelling over the auricle and changed to cefdinir. WBC 6.5, afebrile. Scr 0.73 (CrCl >100 mL/min).   Plan: Cefepime 2g IV every 8 hours Vancomycin 1750 mg IV once then 1g IV every 8 hours (estAUC 539) Monitor renal fx, cx results, clinical pic, and vanc levels as appropriate. F/u CT scan results     Temp (24hrs), Avg:98.6 F (37 C), Min:98.6 F (37 C), Max:98.6 F (37 C)  No results for input(s): WBC, CREATININE, LATICACIDVEN, VANCOTROUGH, VANCOPEAK, VANCORANDOM, GENTTROUGH, GENTPEAK, GENTRANDOM, TOBRATROUGH, TOBRAPEAK, TOBRARND, AMIKACINPEAK, AMIKACINTROU, AMIKACIN in the last 168 hours.  CrCl cannot be calculated (Patient's most recent lab result is older than the maximum 21 days allowed.).    No Known Allergies  Antimicrobials this admission: Vancomycin 10/31 >>  Cefepime 10/31 >>   Dose adjustments this admission: N/A  Microbiology results: None  Thank you for allowing pharmacy to be a part of this patient's care.  Antonietta Jewel, PharmD, BCCCP Clinical Pharmacist  Phone: [phone]  Please check AMION for all Welcome phone numbers After 10:00 PM, call [place] [phone] 07/10/2019 4:10 PM

## 2019-07-10 NOTE — ED Notes (Signed)
Patient verbalizes understanding of discharge instructions. Opportunity for questioning and answers were provided. Armband removed by staff, pt discharged from ED.  

## 2019-07-10 NOTE — ED Notes (Signed)
Patient transported to CT 

## 2019-07-10 NOTE — Discharge Instructions (Signed)
Take the antibiotics as prescribed.  I have written you a short course of pain medicine.  Do not drive or operate heavy machinery while taking this medicine.  You need to follow-up with ear nose and throat by the middle of next week.

## 2019-07-10 NOTE — ED Provider Notes (Signed)
[facility] EMERGENCY DEPARTMENT Provider Note   CSN: [id number] Arrival date & time: 07/10/19  1455     History   Chief Complaint Chief Complaint  Patient presents with   Otalgia    HPI Peggy Hooper is a 28 y.o. female presents for evaluation of acute onset, progressively worsening left ear pain for 2 weeks.  Reports that she went to an urgent care over 1 week ago and was started on amoxicillin orally which she took for several days.  Then 5 days ago she developed a fever of 101.5 F with worsening pain of the left ear, swelling over the auricle and pain around the left side of the jaw into the neck so she returned to the urgent care and was switched to a different antibiotic and antibiotic eardrops.  She is unsure of the name of the new medications but thinks it begins with the letter C.  Reports constant severe throbbing pain, ringing in the ears and hearing loss over the last few days.  She is unsure if she has had any recurrent fever but she has been taking ibuprofen every 6 hours for pain.  Denies nausea, vomiting, sinus pressure or congestion.    The history is provided by the patient.    Past Medical History:  Diagnosis Date   Medical history non-contributory    Pregnant     Patient Active Problem List   Diagnosis Date Noted   NSVD (normal spontaneous vaginal delivery) 04/29/2016   Encounter for sterilization 04/28/2016   Post-dates pregnancy 04/27/2016   MDD (major depressive disorder), recurrent episode, severe (HCC) 04/25/2015   Suicide attempt by drug ingestion (HCC) 04/25/2015   Normocytic anemia 04/25/2015   Hypokalemia 04/25/2015    Past Surgical History:  Procedure Laterality Date   NO PAST SURGERIES     TUBAL LIGATION Bilateral 04/28/2016   Procedure: BILATERAL TUBAL LIGATION;  Surgeon: Tilda Burrow, MD;  Location: [facility];  Service: Gynecology;  Laterality: Bilateral;     OB History    Gravida  1   Para  1   Term    1   Preterm      AB      Living  1     SAB      TAB      Ectopic      Multiple  0   Live Births  1            Home Medications    Prior to Admission medications   Medication Sig Start Date End Date Taking? Authorizing Provider  cefdinir (OMNICEF) 300 MG capsule Take 300 mg by mouth 2 (two) times daily.   Yes [provider]  ciprofloxacin-dexamethasone (CIPRODEX) OTIC suspension Place 4 drops into the left ear 2 (two) times daily.   Yes [provider]  ibuprofen (ADVIL) 200 MG tablet Take 600 mg by mouth every 6 (six) hours as needed for moderate pain.   Yes [provider]  amoxicillin (AMOXIL) 500 MG tablet Take 500 mg by mouth 2 (two) times daily. 07/05/19   [provider]  ibuprofen (ADVIL,MOTRIN) 600 MG tablet Take 1 tablet (600 mg total) by mouth every 6 (six) hours. Patient not taking: Reported on 07/10/2019 04/29/16   Leland Her, DO  Prenatal Vit-Fe Fumarate-FA (PRENATAL COMPLETE) 14-0.4 MG TABS Take 1 tablet by mouth 2 (two) times daily. Patient not taking: Reported on 07/10/2019 08/20/15   Marlon Pel, PA-C  senna-docusate (SENOKOT-S) 8.6-50 MG tablet  Take 2 tablets by mouth daily. Patient not taking: Reported on 07/10/2019 04/29/16   Bufford Lope, DO    Family History Family History  Problem Relation Age of Onset   Diabetes Mother     Social History Social History   Tobacco Use   Smoking status: Former Smoker   Smokeless tobacco: Never Used  Substance Use Topics   Alcohol use: No   Drug use: No     Allergies   Patient has no known allergies.   Review of Systems Review of Systems  Constitutional: Positive for fever.  HENT: Positive for ear pain, facial swelling and hearing loss. Negative for ear discharge.   Eyes: Negative for photophobia.  Musculoskeletal: Negative for neck stiffness.  Neurological: Negative for headaches.  All other systems reviewed and are negative.    Physical  Exam Updated Vital Signs BP (!) 118/100 (BP Location: Left Arm)    Pulse 75    Temp 98.6 F (37 C) (Oral)    Resp 16    LMP 06/24/2019    SpO2 98%   Physical Exam Vitals signs and nursing note reviewed.  Constitutional:      General: She is not in acute distress.    Appearance: She is well-developed.     Comments: Appears uncomfortable, tearful  HENT:     Head: Normocephalic and atraumatic.     Left Ear: Tympanic membrane, ear canal and external ear normal. There is no impacted cerumen.     Ears:     Comments: Right external ear with swelling and tenderness to palpation of and with tugging of the pinna and tragus.  Exquisite tenderness to palpation of the left mastoid.  Periauricular lymphadenopathy noted.  Patient with white-gray discharge to the left external auditory canal with canal edema; I am unable to visualize the left tympanic membrane as a result. Eyes:     General:        Right eye: No discharge.        Left eye: No discharge.     Extraocular Movements: Extraocular movements intact.     Conjunctiva/sclera: Conjunctivae normal.     Pupils: Pupils are equal, round, and reactive to light.     Comments: No restriction with EOMs  Neck:     Musculoskeletal: Normal range of motion and neck supple. Muscular tenderness present.     Vascular: No JVD.     Trachea: No tracheal deviation.     Comments: Left anterior cervical lymphadenopathy. Cardiovascular:     Rate and Rhythm: Normal rate.  Pulmonary:     Effort: Pulmonary effort is normal.  Abdominal:     General: There is no distension.  Lymphadenopathy:     Cervical: Cervical adenopathy present.  Skin:    General: Skin is warm and dry.     Findings: No erythema.  Neurological:     Mental Status: She is alert.  Psychiatric:        Behavior: Behavior normal.      ED Treatments / Results  Labs (all labs ordered are listed, but only abnormal results are displayed) Labs Reviewed  CBC WITH DIFFERENTIAL/PLATELET -  Abnormal; Notable for the following components:      Result Value   RBC 3.85 (*)    All other components within normal limits  BASIC METABOLIC PANEL  I-STAT BETA HCG BLOOD, ED ([facility], [facility], [facility] ONLY)    EKG None  Radiology No results found.  Procedures Procedures (including critical care time)  Medications Ordered  in ED Medications  vancomycin (VANCOCIN) 1,750 mg in sodium chloride 0.9 % 500 mL IVPB (1,750 mg Intravenous New Bag/Given 07/10/19 1728)  ceFEPIme (MAXIPIME) 2 g in sodium chloride 0.9 % 100 mL IVPB (has no administration in time range)  vancomycin (VANCOCIN) IVPB 1000 mg/200 mL premix (has no administration in time range)  HYDROmorphone (DILAUDID) injection 0.5 mg (0.5 mg Intravenous Given 07/10/19 1614)  ceFEPIme (MAXIPIME) 2 g in sodium chloride 0.9 % 100 mL IVPB (0 g Intravenous Stopped 07/10/19 1727)  iohexol (OMNIPAQUE) 300 MG/ML solution 75 mL (75 mLs Intravenous Contrast Given 07/10/19 1736)  ketorolac (TORADOL) 30 MG/ML injection 30 mg (30 mg Intravenous Given 07/10/19 1753)     Initial Impression / Assessment and Plan / ED Course  I have reviewed the triage vital signs and the nursing notes.  Pertinent labs & imaging results that were available during my care of the patient were reviewed by me and considered in my medical decision making (see chart for details).        Patient presents for evaluation of worsening left ear pain.  Has been seen by an urgent care twice for this, had a fever 5 days ago.  No recurrence of fever but has been taking ibuprofen every 6 hours so could possibly be masking this.  No constitutional symptoms.  She appears quite uncomfortable has swelling of her left auricle with marked tenderness along the left mastoid, tragus and pinna.  Also has some surrounding lymphadenopathy extending down to the left anterior neck but no submandibular or submental swelling, no nuchal rigidity or meningeal signs.  She is tolerating secretions without  difficulty and airway appears patent.  No evidence of strep pharyngitis, sinusitis, retropharyngeal abscess or peritonsillar abscess.  No evidence of periorbital cellulitis or ocular nerve entrapment.  Spoke with Dr. Benard Rinkurnes with radiology who recommends obtaining CT temporal bones with contrast for evaluation of possible mastoiditis.  We will also obtain blood work.  I spoke with ED pharmacist Selena BattenKim who did med reconciliation, states that patient had been on amoxicillin since 10/26, then picked up cefdinir 300 mg BID on 10/30.  She advises that it would be reasonable to start the patient on IV vancomycin and cefepime while awaiting results.  If she is to be discharged home she recommends discharging the patient on a course of levofloxacin 750 mg daily for 5 days.  Lab work reviewed by me is reassuring with no leukocytosis, no anemia, no metabolic derangements, no renal insufficiency.  She was given pain medicine in the ED with some improvement.  6:58 PM Awaiting results of CT temporal bones with contrast.  Signed out to oncoming provider PA Henderly.  If imaging is reassuring she will likely be stable for discharge home with levofloxacin or Cipro, consider placement of ear wick.  Also recommend ENT follow-up outpatient.  If imaging is concerning she may require admission to the hospital for IV antibiotics.  Final Clinical Impressions(s) / ED Diagnoses   Final diagnoses:  Acute otitis externa of left ear, unspecified type    ED Discharge Orders    None       Jeanie SewerFawze, Halia Franey A, PA-C 07/10/19 1859    Tilden Fossaees, Elizabeth, MD 07/12/19 [phone]

## 2019-07-10 NOTE — ED Triage Notes (Signed)
Pt endorses left sided earache x 2 weeks, has been treated with antibiotic ear drops without relief.

## 2020-07-13 ENCOUNTER — Encounter (HOSPITAL_COMMUNITY): Payer: Self-pay

## 2020-07-13 ENCOUNTER — Other Ambulatory Visit: Payer: Self-pay

## 2020-07-13 ENCOUNTER — Emergency Department (HOSPITAL_COMMUNITY)
Admission: EM | Admit: 2020-07-13 | Discharge: 2020-07-13 | Disposition: A | Discharge status: Home / Self Care | Payer: Medicaid Other | Attending: Emergency Medicine | Admitting: Emergency Medicine

## 2020-07-13 DIAGNOSIS — R509 Fever, unspecified: Secondary | ICD-10-CM | POA: Insufficient documentation

## 2020-07-13 DIAGNOSIS — H9202 Otalgia, left ear: Secondary | ICD-10-CM | POA: Diagnosis present

## 2020-07-13 DIAGNOSIS — Z87891 Personal history of nicotine dependence: Secondary | ICD-10-CM | POA: Insufficient documentation

## 2020-07-13 MED ORDER — AMOXICILLIN 875 MG PO TABS: 875.0000 mg | ORAL_TABLET | Freq: Two times a day (BID) | ORAL | 0 refills | Status: DC

## 2020-07-13 MED ORDER — CIPROFLOXACIN-DEXAMETHASONE 0.3-0.1 % OT SUSP
4.0000 [drp] | Freq: Once | OTIC | Status: AC
  Administered 2020-07-13: 4 [drp] via OTIC
  Filled 2020-07-13: qty 7.5

## 2020-07-13 NOTE — ED Provider Notes (Signed)
[facility]-EMERGENCY DEPT Provider Note   CSN: [id number] Arrival date & time: 07/13/20  2249     History Chief Complaint  Patient presents with  . Otalgia    Peggy Hooper is a 29 y.o. female with a history of anemia, depression, & prior tubal ligation who presents to the ED with complaints of left ear pain x 2 days. Patient states she has been having pain to the L ear that is constant, progressively worsening, and is without alleviating/aggravating factors. Reports associated subjective fevers. Denies congestion, rhinorrhea, sore throat, cough, dyspnea, or ear drainage. She states this feels similar to an infection she had around this time last year.   HPI     Past Medical History:  Diagnosis Date  . Medical history non-contributory   . Pregnant     Patient Active Problem List   Diagnosis Date Noted  . NSVD (normal spontaneous vaginal delivery) 04/29/2016  . Encounter for sterilization 04/28/2016  . Post-dates pregnancy 04/27/2016  . MDD (major depressive disorder), recurrent episode, severe (HCC) 04/25/2015  . Suicide attempt by drug ingestion (HCC) 04/25/2015  . Normocytic anemia 04/25/2015  . Hypokalemia 04/25/2015    Past Surgical History:  Procedure Laterality Date  . NO PAST SURGERIES    . TUBAL LIGATION Bilateral 04/28/2016   Procedure: BILATERAL TUBAL LIGATION;  Surgeon: Tilda Burrow, MD;  Location: [facility];  Service: Gynecology;  Laterality: Bilateral;     OB History    Gravida  1   Para  1   Term  1   Preterm      AB      Living  1     SAB      TAB      Ectopic      Multiple  0   Live Births  1           Family History  Problem Relation Age of Onset  . Diabetes Mother     Social History   Tobacco Use  . Smoking status: Former Games developer  . Smokeless tobacco: Never Used  Vaping Use  . Vaping Use: Never used  Substance Use Topics  . Alcohol use: No  . Drug use: No    Home Medications Prior to  Admission medications   Medication Sig Start Date End Date Taking? Authorizing Provider  amoxicillin (AMOXIL) 500 MG tablet Take 500 mg by mouth 2 (two) times daily. 07/05/19   [provider]  cefdinir (OMNICEF) 300 MG capsule Take 300 mg by mouth 2 (two) times daily.    [provider]  ciprofloxacin-dexamethasone (CIPRODEX) OTIC suspension Place 4 drops into the left ear 2 (two) times daily.    [provider]  HYDROcodone-acetaminophen (NORCO/VICODIN) 5-325 MG tablet Take 1 tablet by mouth every 4 (four) hours as needed. 07/10/19   Henderly, Britni A, PA-C  ibuprofen (ADVIL) 200 MG tablet Take 600 mg by mouth every 6 (six) hours as needed for moderate pain.    [provider]  ibuprofen (ADVIL,MOTRIN) 600 MG tablet Take 1 tablet (600 mg total) by mouth every 6 (six) hours. Patient not taking: Reported on 07/10/2019 04/29/16   Leland Her, DO  Prenatal Vit-Fe Fumarate-FA (PRENATAL COMPLETE) 14-0.4 MG TABS Take 1 tablet by mouth 2 (two) times daily. Patient not taking: Reported on 07/10/2019 08/20/15   Marlon Pel, PA-C  senna-docusate (SENOKOT-S) 8.6-50 MG tablet Take 2 tablets by mouth daily. Patient not taking: Reported on 07/10/2019 04/29/16   Jeneen Rinks  J, DO    Allergies    Patient has no known allergies.  Review of Systems   Review of Systems  Constitutional: Positive for fever.  HENT: Positive for ear pain. Negative for congestion, ear discharge, sore throat, trouble swallowing and voice change.   Respiratory: Negative for cough and shortness of breath.   Cardiovascular: Negative for chest pain.  Gastrointestinal: Negative for abdominal pain and vomiting.    Physical Exam Updated Vital Signs BP 125/74 (BP Location: Left Arm)   Pulse 85   Temp 98.4 F (36.9 C) (Oral)   Resp 16   SpO2 100%   Physical Exam Vitals and nursing note reviewed.  Constitutional:      General: She is not in acute distress.    Appearance: She is  well-developed.  HENT:     Head: Normocephalic and atraumatic.     Right Ear: Ear canal normal. Tympanic membrane is not perforated, erythematous, retracted or bulging.     Left Ear: Drainage present. No swelling. Tympanic membrane is erythematous and retracted (mild). Tympanic membrane is not perforated or bulging.     Ears:     Comments: Pain with tug test of the L ear.  No mastoid erythema/swelling/tenderness.     Nose:     Right Sinus: No maxillary sinus tenderness or frontal sinus tenderness.     Left Sinus: No maxillary sinus tenderness or frontal sinus tenderness.     Mouth/Throat:     Pharynx: Uvula midline. No oropharyngeal exudate or posterior oropharyngeal erythema.     Comments: Posterior oropharynx is symmetric appearing. Patient tolerating own secretions without difficulty. No trismus. No drooling. No hot potato voice. No swelling beneath the tongue, submandibular compartment is soft.  Eyes:     General:        Right eye: No discharge.        Left eye: No discharge.     Conjunctiva/sclera: Conjunctivae normal.     Pupils: Pupils are equal, round, and reactive to light.  Cardiovascular:     Rate and Rhythm: Normal rate and regular rhythm.     Heart sounds: No murmur heard.   Pulmonary:     Effort: Pulmonary effort is normal. No respiratory distress.     Breath sounds: Normal breath sounds. No wheezing, rhonchi or rales.  Abdominal:     General: There is no distension.     Palpations: Abdomen is soft.     Tenderness: There is no abdominal tenderness.  Musculoskeletal:     Cervical back: Normal range of motion and neck supple. No edema or rigidity.  Lymphadenopathy:     Cervical: No cervical adenopathy.  Skin:    General: Skin is warm and dry.     Findings: No rash.  Neurological:     Mental Status: She is alert.  Psychiatric:        Behavior: Behavior normal.     ED Results / Procedures / Treatments   Labs (all labs ordered are listed, but only abnormal  results are displayed) Labs Reviewed - No data to display  EKG None  Radiology No results found.  Procedures Procedures (including critical care time)  Medications Ordered in ED Medications  ciprofloxacin-dexamethasone (CIPRODEX) 0.3-0.1 % OTIC (EAR) suspension 4 drop (4 drops Left EAR Given 07/13/20 2353)    ED Course  I have reviewed the triage vital signs and the nursing notes.  Pertinent labs & imaging results that were available during my care of the patient were reviewed by  me and considered in my medical decision making (see chart for details).    MDM Rules/Calculators/A&P                         Patient presents to the ED with complaints of L ear pain.  Exam has some features of AOM & some of AOE. No signs of mastoiditis. Not consistent w/ malignant otitis externa. No meningismus. Per chart review ear infection last year required multiple rounds of antibiotics (some oral, some drops). Will tx for both w/ ciprodex & amoxicillin. I discussed results, treatment plan, need for follow-up, and return precautions with the patient. Provided opportunity for questions, patient confirmed understanding and is in agreement with plan.   Final Clinical Impression(s) / ED Diagnoses Final diagnoses:  Left ear pain    Rx / DC Orders ED Discharge Orders         Ordered    amoxicillin (AMOXIL) 875 MG tablet  2 times daily        07/13/20 2345           Victoriano Campion, Bronson R, PA-C 07/14/20 0007    Nira Conn, MD 07/14/20 [phone]

## 2020-07-13 NOTE — Discharge Instructions (Addendum)
You were seen in the emergency department tonight for left ear pain.  Your exam shows findings concerning for both an internal and external ear infection.  We are sending you home with Ciprodex drops to use 4 drops twice per day for the next 1 week as well as amoxicillin to take twice per day for the next 1 week.  We have prescribed you new medication(s) today. Discuss the medications prescribed today with your pharmacist as they can have adverse effects and interactions with your other medicines including over the counter and prescribed medications. Seek medical evaluation if you start to experience new or abnormal symptoms after taking one of these medicines, seek care immediately if you start to experience difficulty breathing, feeling of your throat closing, facial swelling, or rash as these could be indications of a more serious allergic reaction  Please follow-up with your primary care provider within 1 week for recheck.  Return to the ER for new worsening symptoms including but not limited to worsening pain, pain/swelling/redness behind the ear, fever, or any other concerns.

## 2020-07-13 NOTE — ED Triage Notes (Signed)
Pt reports left ear pain x 2 days

## 2020-07-16 ENCOUNTER — Inpatient Hospital Stay (HOSPITAL_COMMUNITY)
Admission: EM | Admit: 2020-07-16 | Discharge: 2020-07-19 | DRG: 153 | Disposition: A | Discharge status: Home / Self Care | Payer: Medicaid Other | Attending: Internal Medicine | Admitting: Internal Medicine

## 2020-07-16 ENCOUNTER — Emergency Department (HOSPITAL_COMMUNITY): Payer: Medicaid Other

## 2020-07-16 ENCOUNTER — Emergency Department (HOSPITAL_COMMUNITY)
Admission: EM | Admit: 2020-07-16 | Discharge: 2020-07-16 | Disposition: A | Discharge status: Home / Self Care | Payer: Medicaid Other | Source: Home / Self Care

## 2020-07-16 ENCOUNTER — Encounter (HOSPITAL_COMMUNITY): Payer: Self-pay | Admitting: Emergency Medicine

## 2020-07-16 ENCOUNTER — Other Ambulatory Visit: Payer: Self-pay

## 2020-07-16 ENCOUNTER — Emergency Department (HOSPITAL_COMMUNITY)
Admission: EM | Admit: 2020-07-16 | Discharge: 2020-07-16 | Disposition: A | Discharge status: Home / Self Care | Payer: Medicaid Other | Source: Home / Self Care | Attending: Emergency Medicine | Admitting: Emergency Medicine

## 2020-07-16 DIAGNOSIS — H7093 Unspecified mastoiditis, bilateral: Principal | ICD-10-CM | POA: Diagnosis present

## 2020-07-16 DIAGNOSIS — Z5321 Procedure and treatment not carried out due to patient leaving prior to being seen by health care provider: Secondary | ICD-10-CM | POA: Insufficient documentation

## 2020-07-16 DIAGNOSIS — H6690 Otitis media, unspecified, unspecified ear: Secondary | ICD-10-CM | POA: Diagnosis present

## 2020-07-16 DIAGNOSIS — E876 Hypokalemia: Secondary | ICD-10-CM | POA: Diagnosis present

## 2020-07-16 DIAGNOSIS — H6693 Otitis media, unspecified, bilateral: Secondary | ICD-10-CM | POA: Diagnosis present

## 2020-07-16 DIAGNOSIS — H60503 Unspecified acute noninfective otitis externa, bilateral: Secondary | ICD-10-CM | POA: Insufficient documentation

## 2020-07-16 DIAGNOSIS — H7090 Unspecified mastoiditis, unspecified ear: Secondary | ICD-10-CM | POA: Diagnosis not present

## 2020-07-16 DIAGNOSIS — Z833 Family history of diabetes mellitus: Secondary | ICD-10-CM

## 2020-07-16 DIAGNOSIS — H7011 Chronic mastoiditis, right ear: Secondary | ICD-10-CM

## 2020-07-16 DIAGNOSIS — E669 Obesity, unspecified: Secondary | ICD-10-CM | POA: Diagnosis present

## 2020-07-16 DIAGNOSIS — Z87891 Personal history of nicotine dependence: Secondary | ICD-10-CM

## 2020-07-16 DIAGNOSIS — H9203 Otalgia, bilateral: Secondary | ICD-10-CM | POA: Insufficient documentation

## 2020-07-16 DIAGNOSIS — Z6830 Body mass index (BMI) 30.0-30.9, adult: Secondary | ICD-10-CM

## 2020-07-16 DIAGNOSIS — Z20822 Contact with and (suspected) exposure to covid-19: Secondary | ICD-10-CM | POA: Diagnosis present

## 2020-07-16 LAB — CBC WITH DIFFERENTIAL/PLATELET
Abs Immature Granulocytes: 0.03 10*3/uL (ref 0.00–0.07)
Basophils Absolute: 0.1 10*3/uL (ref 0.0–0.1)
Basophils Relative: 1 %
Eosinophils Absolute: 0 10*3/uL (ref 0.0–0.5)
Eosinophils Relative: 0 %
HCT: 35.9 % — ABNORMAL LOW (ref 36.0–46.0)
Hemoglobin: 12.2 g/dL (ref 12.0–15.0)
Immature Granulocytes: 0 %
Lymphocytes Relative: 16 %
Lymphs Abs: 1.2 10*3/uL (ref 0.7–4.0)
MCH: 32.7 pg (ref 26.0–34.0)
MCHC: 34 g/dL (ref 30.0–36.0)
MCV: 96.2 fL (ref 80.0–100.0)
Monocytes Absolute: 0.7 10*3/uL (ref 0.1–1.0)
Monocytes Relative: 9 %
Neutro Abs: 5.5 10*3/uL (ref 1.7–7.7)
Neutrophils Relative %: 74 %
Platelets: 184 10*3/uL (ref 150–400)
RBC: 3.73 MIL/uL — ABNORMAL LOW (ref 3.87–5.11)
RDW: 11.6 % (ref 11.5–15.5)
WBC: 7.4 10*3/uL (ref 4.0–10.5)
nRBC: 0 % (ref 0.0–0.2)

## 2020-07-16 LAB — RESPIRATORY PANEL BY RT PCR (FLU A&B, COVID)
Influenza A by PCR: NEGATIVE
Influenza B by PCR: NEGATIVE
SARS Coronavirus 2 by RT PCR: NEGATIVE

## 2020-07-16 LAB — I-STAT BETA HCG BLOOD, ED (MC, WL, AP ONLY): I-stat hCG, quantitative: <5 m[IU]/mL (ref <5)

## 2020-07-16 LAB — URINALYSIS, ROUTINE W REFLEX MICROSCOPIC
Bilirubin Urine: NEGATIVE
Glucose, UA: NEGATIVE mg/dL
Hgb urine dipstick: NEGATIVE
Ketones, ur: 20 mg/dL — AB
Leukocytes,Ua: NEGATIVE
Nitrite: NEGATIVE
Protein, ur: NEGATIVE mg/dL
Specific Gravity, Urine: >1.046 — ABNORMAL HIGH (ref 1.005–1.030)
pH: 5 (ref 5.0–8.0)

## 2020-07-16 LAB — COMPREHENSIVE METABOLIC PANEL
ALT: 13 U/L (ref 0–44)
AST: 23 U/L (ref 15–41)
Albumin: 3.4 g/dL — ABNORMAL LOW (ref 3.5–5.0)
Alkaline Phosphatase: 60 U/L (ref 38–126)
Anion gap: 11 (ref 5–15)
BUN: 6 mg/dL (ref 6–20)
CO2: 22 mmol/L (ref 22–32)
Calcium: 8.8 mg/dL — ABNORMAL LOW (ref 8.9–10.3)
Chloride: 100 mmol/L (ref 98–111)
Creatinine, Ser: 0.71 mg/dL (ref 0.44–1.00)
GFR, Estimated: >60 mL/min (ref >60)
Glucose, Bld: 109 mg/dL — ABNORMAL HIGH (ref 70–99)
Potassium: 3.3 mmol/L — ABNORMAL LOW (ref 3.5–5.1)
Sodium: 133 mmol/L — ABNORMAL LOW (ref 135–145)
Total Bilirubin: 0.5 mg/dL (ref 0.3–1.2)
Total Protein: 7.1 g/dL (ref 6.5–8.1)

## 2020-07-16 LAB — LACTIC ACID, PLASMA: Lactic Acid, Venous: 0.8 mmol/L (ref 0.5–1.9)

## 2020-07-16 MED ORDER — CLINDAMYCIN PHOSPHATE 600 MG/50ML IV SOLN
600.0000 mg | Freq: Once | INTRAVENOUS | Status: AC
  Administered 2020-07-16: 600 mg via INTRAVENOUS
  Filled 2020-07-16: qty 50

## 2020-07-16 MED ORDER — NEOMYCIN-POLYMYXIN-HC 3.5-10000-1 OT SUSP: 4.0000 [drp] | Freq: Four times a day (QID) | OTIC | 0 refills | Status: DC

## 2020-07-16 MED ORDER — ACETAMINOPHEN 650 MG RE SUPP: 650.0000 mg | Freq: Four times a day (QID) | RECTAL | Status: DC | PRN

## 2020-07-16 MED ORDER — ACETAMINOPHEN 325 MG PO TABS
650.0000 mg | ORAL_TABLET | Freq: Four times a day (QID) | ORAL | Status: DC | PRN
  Administered 2020-07-17 (×3): 650 mg via ORAL
  Filled 2020-07-16 (×3): qty 2

## 2020-07-16 MED ORDER — ACETAMINOPHEN 500 MG PO TABS
1000.0000 mg | ORAL_TABLET | Freq: Once | ORAL | Status: AC
  Administered 2020-07-16: 1000 mg via ORAL
  Filled 2020-07-16: qty 2

## 2020-07-16 MED ORDER — KETOROLAC TROMETHAMINE 30 MG/ML IJ SOLN
30.0000 mg | Freq: Once | INTRAMUSCULAR | Status: AC
  Administered 2020-07-16: 30 mg via INTRAVENOUS
  Filled 2020-07-16: qty 1

## 2020-07-16 MED ORDER — SODIUM CHLORIDE 0.9 % IV SOLN
2.0000 g | Freq: Three times a day (TID) | INTRAVENOUS | Status: DC
  Administered 2020-07-16 – 2020-07-19 (×8): 2 g via INTRAVENOUS
  Filled 2020-07-16 (×13): qty 2

## 2020-07-16 MED ORDER — SODIUM CHLORIDE 0.9 % IV BOLUS
1000.0000 mL | Freq: Once | INTRAVENOUS | Status: AC
  Administered 2020-07-16: 1000 mL via INTRAVENOUS

## 2020-07-16 MED ORDER — IOHEXOL 300 MG/ML  SOLN
75.0000 mL | Freq: Once | INTRAMUSCULAR | Status: AC | PRN
  Administered 2020-07-16: 75 mL via INTRAVENOUS

## 2020-07-16 MED ORDER — SODIUM CHLORIDE 0.9 % IV SOLN: INTRAVENOUS | Status: AC

## 2020-07-16 NOTE — ED Notes (Signed)
Pt left due to not being seen quick enough 

## 2020-07-16 NOTE — Progress Notes (Signed)
Patient arrived to [facility] room 6 from [facility] ED. Alert and oriented x4, on room air.

## 2020-07-16 NOTE — ED Provider Notes (Signed)
[facility] EMERGENCY DEPARTMENT Provider Note   CSN: [id number] Arrival date & time: 07/16/20  1553     History Chief Complaint  Patient presents with  . Otalgia    ear infection    Peggy Hooper is a 29 y.o. female who presents for evaluation of fever, bilateral ear pain that has been ongoing for the last 5 days.  She states initially, she started having pain in her left ear.  She was seen at [facility] long emergency department and was diagnosed with an ear infection.  She was prescribed eardrops as well as oral antibiotics which she states she has been taking.  She came in today because she started having bilateral ear pain and felt like the right was greater than the left.  There was some concern that she may be was not using the eardrops right.  She was discharged home.  She states that when she went home, she started having worsening pain.  She also reported a fever at home.  States she has had fevers throughout the last 5 days have ranged between 100-102.  She states that the pain is severe and is worse on the right side.  She has not noted any drainage from the ears.  She denies any chest pain, difficulty breathing, abdominal pain, nausea/vomiting, cough, congestion, sore throat.  The history is provided by the patient.       Past Medical History:  Diagnosis Date  . Medical history non-contributory   . Pregnant     Patient Active Problem List   Diagnosis Date Noted  . Mastoiditis 07/16/2020  . Ear infection 07/16/2020  . NSVD (normal spontaneous vaginal delivery) 04/29/2016  . Encounter for sterilization 04/28/2016  . Post-dates pregnancy 04/27/2016  . MDD (major depressive disorder), recurrent episode, severe (HCC) 04/25/2015  . Suicide attempt by drug ingestion (HCC) 04/25/2015  . Normocytic anemia 04/25/2015  . Hypokalemia 04/25/2015    Past Surgical History:  Procedure Laterality Date  . NO PAST SURGERIES    . TUBAL LIGATION Bilateral 04/28/2016     Procedure: BILATERAL TUBAL LIGATION;  Surgeon: Tilda Burrow, MD;  Location: [facility];  Service: Gynecology;  Laterality: Bilateral;     OB History    Gravida  1   Para  1   Term  1   Preterm      AB      Living  1     SAB      TAB      Ectopic      Multiple  0   Live Births  1           Family History  Problem Relation Age of Onset  . Diabetes Mother     Social History   Tobacco Use  . Smoking status: Former Games developer  . Smokeless tobacco: Never Used  Vaping Use  . Vaping Use: Never used  Substance Use Topics  . Alcohol use: No  . Drug use: No    Home Medications Prior to Admission medications   Medication Sig Start Date End Date Taking? Authorizing Provider  acetaminophen (TYLENOL) 500 MG tablet Take 1,500 mg by mouth every 6 (six) hours as needed for moderate pain.   Yes [provider]  amoxicillin (AMOXIL) 875 MG tablet Take 1 tablet (875 mg total) by mouth 2 (two) times daily. Patient taking differently: Take 875 mg by mouth 2 (two) times daily. Start date: 07/14/20 07/13/20  Yes Petrucelli, Pleas Koch, PA-C  ciprofloxacin-dexamethasone (  CIPRODEX) OTIC suspension Place 4 drops into both ears 2 (two) times daily.   Yes [provider]  neomycin-polymyxin-hydrocortisone (CORTISPORIN) 3.5-10000-1 OTIC suspension Place 4 drops into both ears 4 (four) times daily. Apply 4 times daily for 7 days Patient taking differently: Place 4 drops into both ears 4 (four) times daily.  07/16/20  Yes Wynetta FinesMessick, Peter C, MD    Allergies    Patient has no known allergies.  Review of Systems   Review of Systems  Constitutional: Positive for fever.  HENT: Positive for ear pain. Negative for ear discharge and hearing loss.   Respiratory: Negative for cough and shortness of breath.   Cardiovascular: Negative for chest pain.  Gastrointestinal: Negative for abdominal pain, nausea and vomiting.  Neurological: Negative for headaches.  All other systems  reviewed and are negative.   Physical Exam Updated Vital Signs BP 134/75 (BP Location: Left Arm)   Pulse 91   Temp 98.7 F (37.1 C) (Oral)   Resp 18   Ht 5\' 7"  (1.702 m)   Wt 88.5 kg   LMP 06/22/2020   SpO2 97%   BMI 30.54 kg/m   Physical Exam Vitals and nursing note reviewed.  Constitutional:      Appearance: Normal appearance. She is well-developed.     Comments: Tearful   HENT:     Head: Normocephalic and atraumatic.     Ears:     Comments: Unable to visualized bilateral TMs secondary to external auditory canal edema. Left auditory canal is edematous with some drainage. No pain with movement of the tragus. Right auditory canal is edematous with some drainage. Tenderness to palpation noted to the right mastoid. No overlying warmth, erythema.     Mouth/Throat:     Comments: Posterior oropharynx is erythematous, edematous  Eyes:     General: Lids are normal.     Conjunctiva/sclera: Conjunctivae normal.     Pupils: Pupils are equal, round, and reactive to light.  Cardiovascular:     Rate and Rhythm: Normal rate and regular rhythm.     Pulses: Normal pulses.     Heart sounds: Normal heart sounds. No murmur heard.  No friction rub. No gallop.   Pulmonary:     Effort: Pulmonary effort is normal.     Breath sounds: Normal breath sounds.  Abdominal:     Palpations: Abdomen is soft. Abdomen is not rigid.     Tenderness: There is no abdominal tenderness. There is no guarding.     Comments: Abdomen is soft, non-distended, non-tender. No rigidity, No guarding. No peritoneal signs.  Musculoskeletal:        General: Normal range of motion.     Cervical back: Full passive range of motion without pain.  Skin:    General: Skin is warm and dry.     Capillary Refill: Capillary refill takes less than 2 seconds.  Neurological:     Mental Status: She is alert and oriented to person, place, and time.  Psychiatric:        Speech: Speech normal.     ED Results / Procedures /  Treatments   Labs (all labs ordered are listed, but only abnormal results are displayed) Labs Reviewed  COMPREHENSIVE METABOLIC PANEL - Abnormal; Notable for the following components:      Result Value   Sodium 133 (*)    Potassium 3.3 (*)    Glucose, Bld 109 (*)    Calcium 8.8 (*)    Albumin 3.4 (*)  All other components within normal limits  CBC WITH DIFFERENTIAL/PLATELET - Abnormal; Notable for the following components:   RBC 3.73 (*)    HCT 35.9 (*)    All other components within normal limits  URINALYSIS, ROUTINE W REFLEX MICROSCOPIC - Abnormal; Notable for the following components:   Specific Gravity, Urine >1.046 (*)    Ketones, ur 20 (*)    All other components within normal limits  RESPIRATORY PANEL BY RT PCR (FLU A&B, COVID)  LACTIC ACID, PLASMA  HIV ANTIBODY (ROUTINE TESTING W REFLEX)  CBC  BASIC METABOLIC PANEL  I-STAT BETA HCG BLOOD, ED ([facility], [facility], [facility] ONLY)    EKG None  Radiology CT Maxillofacial W Contrast  Result Date: 07/16/2020 CLINICAL DATA:  Bilateral ear pain. Currently on antibiotics. Worsening ear pain. Evaluate for mastoiditis. Fever. EXAM: CT MAXILLOFACIAL WITH CONTRAST TECHNIQUE: Multidetector CT imaging of the maxillofacial structures was performed with intravenous contrast. Multiplanar CT image reconstructions were also generated. CONTRAST:  42mL OMNIPAQUE IOHEXOL 300 MG/ML  SOLN COMPARISON:  CT temporal bone 07/10/2019 FINDINGS: Osseous: Poor dentition with numerous caries. Caries and periapical lucency around upper and lower teeth on the left. Orbits: No orbital mass edema or fluid collection. Normal globe bilaterally. Extraocular muscles and optic nerve normal bilaterally. Sinuses: Mild mucosal edema in the maxillary sinus bilaterally. Remaining sinuses clear. Hypoplastic frontal sinuses. Left mastoid effusion. Mild amount of soft tissue in the left middle ear lateral to the ossicles. No evidence of cholesteatoma or bone destruction. There is moderate  skin thickening of the left external auditory canal. Overall these changes have improved since 2020 CT. Right mastoid sinus incompletely developed due to chronic mastoiditis, but clear. Mild soft tissue lateral to the malleus without erosion of the scutum. No change from the prior CT. Skin thickening in the right external auditory canal. Soft tissues: Reactive adenopathy in neck bilaterally. Right level 2 lymph node 11 mm. Left level 2 lymph node 10 mm. No soft tissue mass. Bilateral tonsilliths. Limited intracranial: Negative IMPRESSION: 1. Left mastoid effusion. Small amount of soft tissue thickening lateral to the ossicles on the left. Moderate thickening of the skin in the external canal. These findings are compatible with inflammation and have improved since the CT temporal bone 07/10/2019. 2. 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 Small amount of soft tissue thickening lateral to the ossicles unchanged from the prior CT temporal bone and 2020. 3. Poor dentition with multiple caries and periapical lucencies around upper and lower teeth on the left. Electronically Signed   By: Marlan Palau M.D.   On: 07/16/2020 20:13    Procedures Procedures (including critical care time)  Medications Ordered in ED Medications  0.9 %  sodium chloride infusion (has no administration in time range)  acetaminophen (TYLENOL) tablet 650 mg (has no administration in time range)    Or  acetaminophen (TYLENOL) suppository 650 mg (has no administration in time range)  meropenem (MERREM) 2 g in sodium chloride 0.9 % 100 mL IVPB (has no administration in time range)  acetaminophen (TYLENOL) tablet 1,000 mg (1,000 mg Oral Given 07/16/20 1900)  sodium chloride 0.9 % bolus 1,000 mL (1,000 mLs Intravenous New Bag/Given 07/16/20 2029)  ketorolac (TORADOL) 30 MG/ML injection 30 mg (30 mg Intravenous Given 07/16/20 1900)  iohexol (OMNIPAQUE) 300 MG/ML solution 75 mL (75 mLs Intravenous Contrast Given 07/16/20  1954)  clindamycin (CLEOCIN) IVPB 600 mg (0 mg Intravenous Stopped 07/16/20 2213)    ED Course  I have reviewed the triage  vital signs and the nursing notes.  Pertinent labs & imaging results that were available during my care of the patient were reviewed by me and considered in my medical decision making (see chart for details).    MDM Rules/Calculators/A&P                          29 y.o. F who presents for evaluation of bilateral ear pain. She initially started having left ear pain 5 days ago.  She was seen at [facility] long and was diagnosed with an ear infection and given antibiotics as well as eardrops.  She reports that she has been taking his antibiotics but states she is continuing pain.  Now she reports that her right ear is hurting as well.  She was seen here earlier today at [facility] ED and was discharged home.  She eats when she got home, she started having fever, worsening ear pain, prompting ED visit.  On initial arrival, she is tachycardic, tachypneic, febrile.  On exam, and unable to visualize either TM secondary to external auditory canal edema.  She also has some drainage.  Right mastoid is tender but no overlying warmth, erythema.  Left mastoid is without any warmth, erythema, tenderness.  Posterior oropharynx is clear.  She is not any cough, chest pain, difficulty breathing.  We will plan for labs.  Given her persistent symptoms as well as the fact that she is newly febrile here with SIRS criteria, will plan for imaging for evaluation of possible mastoiditis.  Lactic is normal.  I-STAT beta is negative.  CBC shows no leukocytosis.  Hemoglobin stable at 12.2.  CMP shows potassium of 3.3.  BUN of 6, creatinine of 0.71.  CT maxillofacial shows left mastoid effusion with small amount of tissue thickening lateral to ossicles. She has chronic mastoiditis on the right with mastoid effusion.  Discussed patient with Dr. Elijah Birk (ENT).  I reviewed the CT findings with him.  At this time, he  does not feel that there needs to be any emergent ENT intervention.  He feels like from an ENT standpoint, patient can can follow up with outpatient ENT tomorrow.  He feels like if we are concerned about possible sepsis, patient can be admitted to medicine.   Patient with SIRS vitals here in the ED which were new from prior visit from earlier today.  Additionally, she has been on antibiotics x5 days with no improvement.  I discussed at length with her and offered her admission versus pain control at home with follow-up in the ear nose and throat doctor tomorrow.  After extensive discussion, patient wished to be admitted.  I feel that this is reasonable given her worsening condition while being on antibiotics.  Will start IV antibiotics.  Discussed Dr. Toniann Fail (hospitalist) who accepts patient for admission.  Portions of this note were generated with Scientist, clinical (histocompatibility and immunogenetics). Dictation errors may occur despite best attempts at proofreading.   Final Clinical Impression(s) / ED Diagnoses Final diagnoses:  Chronic mastoiditis of right side  Acute otitis externa of both ears, unspecified type    Rx / DC Orders ED Discharge Orders    None       Rosana Hoes 07/16/20 2244    Tegeler, Canary Brim, MD 07/17/20 0000

## 2020-07-16 NOTE — H&P (Signed)
History and Physical    Media Pizzini TIR:[id number] DOB: 1990-11-07 DOA: 07/16/2020  PCP: Department, [facility]  Patient coming from: Home.  Chief Complaint: Bilateral ear pain.  HPI: Daksha Koone is a 29 y.o. female with no significant past medical history has been experiencing bilateral ear pain last few days.  It started off in the left ear on the posterior aspect around the mastoid area about 5 days ago and 2 days later patient came to the ER was given eardrops and oral antibiotics despite taking which pain worsened and also start involving the right mastoid area.  No discharge seen from the ears.  Has been experiencing fever and chills temperatures are around 102 F at home.  ED Course: In the ER patient was tachycardic with a fever 101.4 CT scan shows features concerning for mastoiditis involving both the ears.  ER physician discussed with on-call ENT surgeon Dr. Elijah Birk.  Since patient was having SIRS picture despite taking oral antiway admitted for further observation with IV antibiotics.  Review of Systems: As per HPI, rest all negative.   Past Medical History:  Diagnosis Date  . Medical history non-contributory   . Pregnant     Past Surgical History:  Procedure Laterality Date  . NO PAST SURGERIES    . TUBAL LIGATION Bilateral 04/28/2016   Procedure: BILATERAL TUBAL LIGATION;  Surgeon: Tilda Burrow, MD;  Location: [facility];  Service: Gynecology;  Laterality: Bilateral;     reports that she has quit smoking. She has never used smokeless tobacco. She reports that she does not drink alcohol and does not use drugs.  No Known Allergies  Family History  Problem Relation Age of Onset  . Diabetes Mother     Prior to Admission medications   Medication Sig Start Date End Date Taking? Authorizing Provider  acetaminophen (TYLENOL) 500 MG tablet Take 1,500 mg by mouth every 6 (six) hours as needed for moderate pain.   Yes [provider]  amoxicillin  (AMOXIL) 875 MG tablet Take 1 tablet (875 mg total) by mouth 2 (two) times daily. Patient taking differently: Take 875 mg by mouth 2 (two) times daily. Start date: 07/14/20 07/13/20  Yes Petrucelli, Samantha R, PA-C  ciprofloxacin-dexamethasone (CIPRODEX) OTIC suspension Place 4 drops into both ears 2 (two) times daily.   Yes [provider]  neomycin-polymyxin-hydrocortisone (CORTISPORIN) 3.5-10000-1 OTIC suspension Place 4 drops into both ears 4 (four) times daily. Apply 4 times daily for 7 days Patient taking differently: Place 4 drops into both ears 4 (four) times daily.  07/16/20  Yes Wynetta Fines, MD    Physical Exam: Constitutional: Moderately built and nourished. Vitals:   07/16/20 1558 07/16/20 2028  BP: (!) 163/109 134/75  Pulse: (!) 105 91  Resp: (!) 22 18  Temp: (!) 101.6 F (38.7 C) 98.7 F (37.1 C)  TempSrc: Oral Oral  SpO2: 99% 97%  Weight: 88.5 kg   Height: 5\' 7"  (1.702 m)    Eyes: Anicteric no pallor. ENMT: Tenderness around the left mastoid and right mastoid area with mild fullness.  No active discharge seen in the ears.  External canal was difficult to visualize. Neck: Mild swelling of the mastoid area.  No neck rigidity. Respiratory: No rhonchi or crepitations. Cardiovascular: S1-S2 heard. Abdomen: Soft nontender bowel sounds present. Musculoskeletal: No edema. Skin: No rash. Neurologic: Alert awake oriented to time place and person.  Moves all extremities. Psychiatric: Appears normal.  Normal affect.   Labs on Admission: I have  personally reviewed following labs and imaging studies  CBC: Recent Labs  Lab 07/16/20 1753  WBC 7.4  NEUTROABS 5.5  HGB 12.2  HCT 35.9*  MCV 96.2  PLT 184   Basic Metabolic Panel: Recent Labs  Lab 07/16/20 1753  NA 133*  K 3.3*  CL 100  CO2 22  GLUCOSE 109*  BUN 6  CREATININE 0.71  CALCIUM 8.8*   GFR: Estimated Creatinine Clearance: 119.7 mL/min (by C-G formula based on SCr of 0.71 mg/dL). Liver  Function Tests: Recent Labs  Lab 07/16/20 1753  AST 23  ALT 13  ALKPHOS 60  BILITOT 0.5  PROT 7.1  ALBUMIN 3.4*   No results for input(s): LIPASE, AMYLASE in the last 168 hours. No results for input(s): AMMONIA in the last 168 hours. Coagulation Profile: No results for input(s): INR, PROTIME in the last 168 hours. Cardiac Enzymes: No results for input(s): CKTOTAL, CKMB, CKMBINDEX, TROPONINI in the last 168 hours. BNP (last 3 results) No results for input(s): PROBNP in the last 8760 hours. HbA1C: No results for input(s): HGBA1C in the last 72 hours. CBG: No results for input(s): GLUCAP in the last 168 hours. Lipid Profile: No results for input(s): CHOL, HDL, LDLCALC, TRIG, CHOLHDL, LDLDIRECT in the last 72 hours. Thyroid Function Tests: No results for input(s): TSH, T4TOTAL, FREET4, T3FREE, THYROIDAB in the last 72 hours. Anemia Panel: No results for input(s): VITAMINB12, FOLATE, FERRITIN, TIBC, IRON, RETICCTPCT in the last 72 hours. Urine analysis:    Component Value Date/Time   COLORURINE YELLOW 07/16/2020 2000   APPEARANCEUR CLEAR 07/16/2020 2000   LABSPEC >1.046 (H) 07/16/2020 2000   PHURINE 5.0 07/16/2020 2000   GLUCOSEU NEGATIVE 07/16/2020 2000   HGBUR NEGATIVE 07/16/2020 2000   BILIRUBINUR NEGATIVE 07/16/2020 2000   KETONESUR 20 (A) 07/16/2020 2000   PROTEINUR NEGATIVE 07/16/2020 2000   NITRITE NEGATIVE 07/16/2020 2000   LEUKOCYTESUR NEGATIVE 07/16/2020 2000   Sepsis Labs: @LABRCNTIP (procalcitonin:4,lacticidven:4) ) Recent Results (from the past 240 hour(s))  Respiratory Panel by RT PCR (Flu A&B, Covid) - Nasopharyngeal Swab     Status: None   Collection Time: 07/16/20  8:29 PM   Specimen: Nasopharyngeal Swab  Result Value Ref Range Status   SARS Coronavirus 2 by RT PCR NEGATIVE NEGATIVE Final    Comment: (NOTE) SARS-CoV-2 target nucleic acids are NOT DETECTED.  The SARS-CoV-2 RNA is generally detectable in upper respiratoy specimens during the acute  phase of infection. The lowest concentration of SARS-CoV-2 viral copies this assay can detect is 131 copies/mL. A negative result does not preclude SARS-Cov-2 infection and should not be used as the sole basis for treatment or other patient management decisions. A negative result may occur with  improper specimen collection/handling, submission of specimen other than nasopharyngeal swab, presence of viral mutation(s) within the areas targeted by this assay, and inadequate number of viral copies (<131 copies/mL). A negative result must be combined with clinical observations, patient history, and epidemiological information. The expected result is Negative.  Fact Sheet for Patients:  13/07/21  Fact Sheet for Healthcare Providers:  https://www.moore.com/  This test is no t yet approved or cleared by the https://www.young.biz/ FDA and  has been authorized for detection and/or diagnosis of SARS-CoV-2 by FDA under an Emergency Use Authorization (EUA). This EUA will remain  in effect (meaning this test can be used) for the duration of the COVID-19 declaration under Section 564(b)(1) of the Act, 21 U.S.C. section 360bbb-3(b)(1), unless the authorization is terminated or revoked sooner.  Influenza A by PCR NEGATIVE NEGATIVE Final   Influenza B by PCR NEGATIVE NEGATIVE Final    Comment: (NOTE) The Xpert Xpress SARS-CoV-2/FLU/RSV assay is intended as an aid in  the diagnosis of influenza from Nasopharyngeal swab specimens and  should not be used as a sole basis for treatment. Nasal washings and  aspirates are unacceptable for Xpert Xpress SARS-CoV-2/FLU/RSV  testing.  Fact Sheet for Patients: https://www.moore.com/https://www.fda.gov/media/142436/download  Fact Sheet for Healthcare Providers: https://www.young.biz/https://www.fda.gov/media/142435/download  This test is not yet approved or cleared by the [place] FDA and  has been authorized for detection and/or diagnosis of  SARS-CoV-2 by  FDA under an Emergency Use Authorization (EUA). This EUA will remain  in effect (meaning this test can be used) for the duration of the  Covid-19 declaration under Section 564(b)(1) of the Act, 21  U.S.C. section 360bbb-3(b)(1), unless the authorization is  terminated or revoked. Performed at [facility], [address] [place], [place] [address]      Radiological Exams on Admission: CT Maxillofacial W Contrast  Result Date: 07/16/2020 CLINICAL DATA:  Bilateral ear pain. Currently on antibiotics. Worsening ear pain. Evaluate for mastoiditis. Fever. EXAM: CT MAXILLOFACIAL WITH CONTRAST TECHNIQUE: Multidetector CT imaging of the maxillofacial structures was performed with intravenous contrast. Multiplanar CT image reconstructions were also generated. CONTRAST:  75mL OMNIPAQUE IOHEXOL 300 MG/ML  SOLN COMPARISON:  CT temporal bone 07/10/2019 FINDINGS: Osseous: Poor dentition with numerous caries. Caries and periapical lucency around upper and lower teeth on the left. Orbits: No orbital mass edema or fluid collection. Normal globe bilaterally. Extraocular muscles and optic nerve normal bilaterally. Sinuses: Mild mucosal edema in the maxillary sinus bilaterally. Remaining sinuses clear. Hypoplastic frontal sinuses. Left mastoid effusion. Mild amount of soft tissue in the left middle ear lateral to the ossicles. No evidence of cholesteatoma or bone destruction. There is moderate skin thickening of the left external auditory canal. Overall these changes have improved since 2020 CT. Right mastoid sinus incompletely developed due to chronic mastoiditis, but clear. Mild soft tissue lateral to the malleus without erosion of the scutum. No change from the prior CT. Skin thickening in the right external auditory canal. Soft tissues: Reactive adenopathy in neck bilaterally. Right level 2 lymph node 11 mm. Left level 2 lymph node 10 mm. No soft tissue mass. Bilateral tonsilliths. Limited  intracranial: Negative IMPRESSION: 1. Left mastoid effusion. Small amount of soft tissue thickening lateral to the ossicles on the left. Moderate thickening of the skin in the external canal. These findings are compatible with inflammation and have improved since the CT temporal bone 07/10/2019. 2. 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 Small amount of soft tissue thickening lateral to the ossicles unchanged from the prior CT temporal bone and 2020. 3. Poor dentition with multiple caries and periapical lucencies around upper and lower teeth on the left. Electronically Signed   By: Marlan Palauharles  Clark M.D.   On: 07/16/2020 20:13     Assessment/Plan Principal Problem:   Mastoiditis Active Problems:   Ear infection    1. Bilateral mastoiditis with your infection SIRS picture on admission we will keep patient on IV antibiotics fluids and closely observe.  If symptoms does not get better reconsult ENT in the morning. 2. Mild hypokalemia replace and recheck.   DVT prophylaxis: SCDs for now. Code Status: Full code. Family Communication: Discussed with patient. Disposition Plan: Home. Consults called: ER physician discussed with on-call ENT surgeon Dr. Elijah Birkaldwell. Admission status: Observation.   Meryle ReadyArshad N  Toniann Fail MD [facility] Pager [phone].  If 7PM-7AM, please contact night-coverage [email or web address] Password [facility]  07/16/2020, 10:15 PM

## 2020-07-16 NOTE — ED Triage Notes (Signed)
  Patient comes in with bilateral ear pain that has been going on for a few days.  Was seen at [facility] and diagnosed with ear infection and given amoxicillin.  Patient states she has been taking it but ear pain has gotten worse.  Fevers at home as high as 101.0 orally.  Pain 10/10 sharp/pressure in both ears.  No drainage.

## 2020-07-16 NOTE — ED Provider Notes (Signed)
[facility] EMERGENCY DEPARTMENT Provider Note   CSN: [id number] Arrival date & time: 07/16/20  1140     History Chief Complaint  Patient presents with  . Otalgia    Peggy Hooper is a 28 y.o. female.  29 year old female with prior medical history as detailed below presents for evaluation.  Patient describes bilateral ear pain.  Patient's pain is worse with movement of the external ear.  Patient reports recent evaluation at [facility] for same complaint.  She was placed on oral antibiotics and was given antibiotic eardrops per report.  She reports that she has been using these with minimal effect.  She reports that she would typically put 4 drops into the ear and then almost immediately stand up.  She denies other complaint.  She reports prior episode of similar symptoms 1 year prior.  At that time she was told that her eardrops were "not getting into the ear canal correctly."  She may have had an ear wick placed at that time.  The history is provided by the patient and medical records.  Otalgia Location:  Bilateral Behind ear:  No abnormality Quality:  Aching Severity:  Mild Onset quality:  Gradual Duration:  1 week Timing:  Constant Progression:  Worsening Chronicity:  New      Past Medical History:  Diagnosis Date  . Medical history non-contributory   . Pregnant     Patient Active Problem List   Diagnosis Date Noted  . NSVD (normal spontaneous vaginal delivery) 04/29/2016  . Encounter for sterilization 04/28/2016  . Post-dates pregnancy 04/27/2016  . MDD (major depressive disorder), recurrent episode, severe (HCC) 04/25/2015  . Suicide attempt by drug ingestion (HCC) 04/25/2015  . Normocytic anemia 04/25/2015  . Hypokalemia 04/25/2015    Past Surgical History:  Procedure Laterality Date  . NO PAST SURGERIES    . TUBAL LIGATION Bilateral 04/28/2016   Procedure: BILATERAL TUBAL LIGATION;  Surgeon: Tilda Burrow, MD;  Location: [facility];   Service: Gynecology;  Laterality: Bilateral;     OB History    Gravida  1   Para  1   Term  1   Preterm      AB      Living  1     SAB      TAB      Ectopic      Multiple  0   Live Births  1           Family History  Problem Relation Age of Onset  . Diabetes Mother     Social History   Tobacco Use  . Smoking status: Former Games developer  . Smokeless tobacco: Never Used  Vaping Use  . Vaping Use: Never used  Substance Use Topics  . Alcohol use: No  . Drug use: No    Home Medications Prior to Admission medications   Medication Sig Start Date End Date Taking? Authorizing Provider  amoxicillin (AMOXIL) 875 MG tablet Take 1 tablet (875 mg total) by mouth 2 (two) times daily. 07/13/20   Petrucelli, Samantha R, PA-C  ibuprofen (ADVIL) 200 MG tablet Take 600 mg by mouth every 6 (six) hours as needed for moderate pain.    [provider]  neomycin-polymyxin-hydrocortisone (CORTISPORIN) 3.5-10000-1 OTIC suspension Place 4 drops into both ears 4 (four) times daily. Apply 4 times daily for 7 days 07/16/20   Wynetta Fines, MD    Allergies    Patient has no known allergies.  Review of Systems  Review of Systems  HENT: Positive for ear pain.   All other systems reviewed and are negative.   Physical Exam Updated Vital Signs BP 94/67 (BP Location: Left Arm)   Pulse 95   Temp 98.7 F (37.1 C) (Oral)   Resp 20   Ht 5\' 7"  (1.702 m)   Wt 88.5 kg   LMP 06/22/2020   SpO2 97%   BMI 30.54 kg/m   Physical Exam Vitals and nursing note reviewed.  Constitutional:      General: She is not in acute distress.    Appearance: She is well-developed.  HENT:     Head: Normocephalic and atraumatic.     Ears:     Comments: Bilateral ear canals are erythematous and edematous.  Patient's presentation is consistent with bilateral otitis externa. Eyes:     Conjunctiva/sclera: Conjunctivae normal.     Pupils: Pupils are equal, round, and reactive to light.    Cardiovascular:     Rate and Rhythm: Normal rate and regular rhythm.     Heart sounds: Normal heart sounds.  Pulmonary:     Effort: Pulmonary effort is normal. No respiratory distress.     Breath sounds: Normal breath sounds.  Abdominal:     General: There is no distension.     Palpations: Abdomen is soft.     Tenderness: There is no abdominal tenderness.  Musculoskeletal:        General: No deformity. Normal range of motion.     Cervical back: Normal range of motion and neck supple.  Skin:    General: Skin is warm and dry.  Neurological:     Mental Status: She is alert and oriented to person, place, and time.     ED Results / Procedures / Treatments   Labs (all labs ordered are listed, but only abnormal results are displayed) Labs Reviewed - No data to display  EKG None  Radiology No results found.  Procedures Procedures (including critical care time)  Medications Ordered in ED Medications - No data to display  ED Course  I have reviewed the triage vital signs and the nursing notes.  Pertinent labs & imaging results that were available during my care of the patient were reviewed by me and considered in my medical decision making (see chart for details).    MDM Rules/Calculators/A&P                          DM  Screen complete  Peggy Hooper was evaluated in Emergency Department on 07/16/2020 for the symptoms described in the history of present illness. She was evaluated in the context of the global COVID-19 pandemic, which necessitated consideration that the patient might be at risk for infection with the SARS-CoV-2 virus that causes COVID-19. Institutional protocols and algorithms that pertain to the evaluation of patients at risk for COVID-19 are in a state of rapid change based on information released by regulatory bodies including the CDC and federal and state organizations. These policies and algorithms were followed during the patient's care in the  ED.   Patient is presenting with complaint of bilateral ear pain.  Exam is consistent with bilateral otitis externa.  Patient does have antibiotic eardrops and oral antibiotics.  Patient's administration of otic drops is unlikely to be effective.  Patient is reporting that she does not place the drops and then allow gravity to draw the drop in.  Her ear canals are still patent enough to allow  the drops to work.  She does not appear to require ear wick placement at this time.  Patient is advised to change her administration technique of otic drops.  She is advised to follow-up closely with ENT if her symptoms fail to improve.  Importance of close follow-up is stressed.  Strict return precautions given and understood.  Final Clinical Impression(s) / ED Diagnoses Final diagnoses:  Acute otitis externa of both ears, unspecified type    Rx / DC Orders ED Discharge Orders         Ordered    neomycin-polymyxin-hydrocortisone (CORTISPORIN) 3.5-10000-1 OTIC suspension  4 times daily        07/16/20 1226           Wynetta Fines, MD 07/16/20 1239

## 2020-07-16 NOTE — Progress Notes (Signed)
Pharmacy Antibiotic Note  Peggy Hooper is a 29 y.o. female admitted on 07/16/2020 with mastoiditis.  Pharmacy has been consulted for meropenem dosing.  Plan: Meropenem 2g IV q 8 hrs  Height: 5\' 7"  (170.2 cm) Weight: 88.5 kg (195 lb) IBW/kg (Calculated) : 61.6  Temp (24hrs), Avg:99.5 F (37.5 C), Min:98.7 F (37.1 C), Max:101.6 F (38.7 C)  Recent Labs  Lab 07/16/20 1753  WBC 7.4  CREATININE 0.71  LATICACIDVEN 0.8    Estimated Creatinine Clearance: 119.7 mL/min (by C-G formula based on SCr of 0.71 mg/dL).    No Known Allergies  Antimicrobials this admission: Meropenem 11/7>   Dose adjustments this admission:  Microbiology results:   Thank you for allowing pharmacy to be a part of this patient's care.  08-16-1983 07/16/2020 10:31 PM

## 2020-07-16 NOTE — Discharge Instructions (Signed)
Please return for any problem.   Use prescribed ear drops.

## 2020-07-16 NOTE — ED Triage Notes (Addendum)
Pt coming from home. Complaint of bilateral ear pain. Was seen here today and [facility] Thursday. Pt state pain has gotten worse. Patient took tylenol after leaving this afternoon.

## 2020-07-16 NOTE — ED Triage Notes (Signed)
Pt reports bilateral ear pain.  States it started on Wednesday and she was seen at [facility] on Thursday and started on amoxicillin.  Initially 1 ear but now both.

## 2020-07-16 NOTE — ED Notes (Signed)
Patient verbalized understanding of discharge instructions. Opportunity for questions and answers.  

## 2020-07-17 DIAGNOSIS — Z6830 Body mass index (BMI) 30.0-30.9, adult: Secondary | ICD-10-CM | POA: Diagnosis not present

## 2020-07-17 DIAGNOSIS — H6690 Otitis media, unspecified, unspecified ear: Secondary | ICD-10-CM | POA: Diagnosis present

## 2020-07-17 DIAGNOSIS — H60503 Unspecified acute noninfective otitis externa, bilateral: Secondary | ICD-10-CM | POA: Diagnosis present

## 2020-07-17 DIAGNOSIS — Z87891 Personal history of nicotine dependence: Secondary | ICD-10-CM | POA: Diagnosis not present

## 2020-07-17 DIAGNOSIS — E876 Hypokalemia: Secondary | ICD-10-CM | POA: Diagnosis present

## 2020-07-17 DIAGNOSIS — E669 Obesity, unspecified: Secondary | ICD-10-CM | POA: Diagnosis present

## 2020-07-17 DIAGNOSIS — Z833 Family history of diabetes mellitus: Secondary | ICD-10-CM | POA: Diagnosis not present

## 2020-07-17 DIAGNOSIS — H7093 Unspecified mastoiditis, bilateral: Secondary | ICD-10-CM | POA: Diagnosis present

## 2020-07-17 DIAGNOSIS — Z20822 Contact with and (suspected) exposure to covid-19: Secondary | ICD-10-CM | POA: Diagnosis present

## 2020-07-17 DIAGNOSIS — H7090 Unspecified mastoiditis, unspecified ear: Secondary | ICD-10-CM | POA: Diagnosis not present

## 2020-07-17 DIAGNOSIS — H6693 Otitis media, unspecified, bilateral: Secondary | ICD-10-CM | POA: Diagnosis present

## 2020-07-17 LAB — BASIC METABOLIC PANEL
Anion gap: 8 (ref 5–15)
BUN: 7 mg/dL (ref 6–20)
CO2: 25 mmol/L (ref 22–32)
Calcium: 8.4 mg/dL — ABNORMAL LOW (ref 8.9–10.3)
Chloride: 102 mmol/L (ref 98–111)
Creatinine, Ser: 0.74 mg/dL (ref 0.44–1.00)
GFR, Estimated: >60 mL/min (ref >60)
Glucose, Bld: 93 mg/dL (ref 70–99)
Potassium: 3 mmol/L — ABNORMAL LOW (ref 3.5–5.1)
Sodium: 135 mmol/L (ref 135–145)

## 2020-07-17 LAB — CBC
HCT: 33.4 % — ABNORMAL LOW (ref 36.0–46.0)
Hemoglobin: 11.3 g/dL — ABNORMAL LOW (ref 12.0–15.0)
MCH: 33.1 pg (ref 26.0–34.0)
MCHC: 33.8 g/dL (ref 30.0–36.0)
MCV: 97.9 fL (ref 80.0–100.0)
Platelets: 175 10*3/uL (ref 150–400)
RBC: 3.41 MIL/uL — ABNORMAL LOW (ref 3.87–5.11)
RDW: 11.7 % (ref 11.5–15.5)
WBC: 5.7 10*3/uL (ref 4.0–10.5)
nRBC: 0 % (ref 0.0–0.2)

## 2020-07-17 LAB — HIV ANTIBODY (ROUTINE TESTING W REFLEX): HIV Screen 4th Generation wRfx: NONREACTIVE

## 2020-07-17 MED ORDER — KETOROLAC TROMETHAMINE 30 MG/ML IJ SOLN: 30.0000 mg | Freq: Four times a day (QID) | INTRAMUSCULAR | Status: DC | PRN

## 2020-07-17 MED ORDER — POTASSIUM CHLORIDE CRYS ER 20 MEQ PO TBCR
20.0000 meq | EXTENDED_RELEASE_TABLET | Freq: Once | ORAL | Status: AC
  Administered 2020-07-17: 20 meq via ORAL
  Filled 2020-07-17: qty 1

## 2020-07-17 MED ORDER — MORPHINE SULFATE (PF) 2 MG/ML IV SOLN
1.0000 mg | INTRAVENOUS | Status: DC | PRN
  Administered 2020-07-17: 1 mg via INTRAVENOUS
  Filled 2020-07-17: qty 1

## 2020-07-17 MED ORDER — NEOMYCIN-POLYMYXIN-HC 3.5-10000-1 OT SUSP: 4.0000 [drp] | Freq: Four times a day (QID) | OTIC | Status: DC

## 2020-07-17 MED ORDER — POTASSIUM CHLORIDE CRYS ER 20 MEQ PO TBCR
40.0000 meq | EXTENDED_RELEASE_TABLET | Freq: Once | ORAL | Status: AC
  Administered 2020-07-17: 40 meq via ORAL
  Filled 2020-07-17: qty 2

## 2020-07-17 MED ORDER — KETOROLAC TROMETHAMINE 30 MG/ML IJ SOLN
30.0000 mg | Freq: Four times a day (QID) | INTRAMUSCULAR | Status: AC | PRN
  Administered 2020-07-17 – 2020-07-18 (×3): 30 mg via INTRAVENOUS
  Filled 2020-07-17 (×3): qty 1

## 2020-07-17 MED ORDER — TRAMADOL HCL 50 MG PO TABS
50.0000 mg | ORAL_TABLET | Freq: Four times a day (QID) | ORAL | Status: DC | PRN
  Administered 2020-07-17 – 2020-07-18 (×2): 50 mg via ORAL
  Filled 2020-07-17 (×2): qty 1

## 2020-07-17 MED ORDER — NEOMYCIN-POLYMYXIN-HC 3.5-10000-1 OT SUSP
4.0000 [drp] | Freq: Four times a day (QID) | OTIC | Status: DC
  Administered 2020-07-17 (×2): 4 [drp] via OTIC
  Filled 2020-07-17 (×2): qty 10

## 2020-07-17 MED ORDER — HYDROMORPHONE HCL 1 MG/ML IJ SOLN
0.5000 mg | INTRAMUSCULAR | Status: DC | PRN
  Administered 2020-07-17 (×2): 0.5 mg via INTRAVENOUS
  Filled 2020-07-17 (×2): qty 1

## 2020-07-17 NOTE — Progress Notes (Signed)
Progress Note    Peggy Hooper  FAO:[id number] DOB: 1991/01/21  DOA: 07/16/2020 PCP: Department, [facility]    Brief Narrative:     Medical records reviewed and are as summarized below:  Peggy Hooper is an 29 y.o. female with no significant past medical history has been experiencing bilateral ear pain last few days.  It started off in the left ear on the posterior aspect around the mastoid area about 5 days ago and 2 days later patient came to the ER was given eardrops and oral antibiotics despite taking which pain worsened and also start involving the right mastoid area.  No discharge seen from the ears.  Has been experiencing fever and chills temperatures are around 102 F at home.  Reportedly failed outpatient abx.    Assessment/Plan:   Principal Problem:   Mastoiditis Active Problems:   Hypokalemia   Ear infection    B/L mastoiditis -IV Abx -needs referral to ENT outpatient -pain control- avoid IV narcotics  Mild hypokalemia -replete  obesity Body mass index is 30.54 kg/m.   Family Communication/Anticipated D/C date and plan/Code Status   DVT prophylaxis: scd Code Status: Full Code.  Disposition Plan: Status is: Inpatient  Remains inpatient appropriate because:IV treatments appropriate due to intensity of illness or inability to take PO   Dispo: The patient is from: Home              Anticipated d/c is to: Home              Anticipated d/c date is: 1 day              Patient currently is not medically stable to d/c.         Medical Consultants:    ENT (phone in ER)     Subjective:   Left ear feels better, right ear still hurts  Objective:    Vitals:   07/16/20 2028 07/16/20 2345 07/17/20 0536 07/17/20 1159  BP: 134/75 123/73 116/70 121/66  Pulse: 91 71 77 83  Resp: 18 18 18 16   Temp: 98.7 F (37.1 C) 98.1 F (36.7 C) 98.1 F (36.7 C) 98 F (36.7 C)  TempSrc: Oral Oral Oral Oral  SpO2: 97% 97% 99% 100%  Weight:       Height:        Intake/Output Summary (Last 24 hours) at 07/17/2020 1435 Last data filed at 07/17/2020 1122 Gross per 24 hour  Intake 1044.78 ml  Output --  Net 1044.78 ml   Filed Weights   07/16/20 1558  Weight: 88.5 kg    Exam:  General: Appearance:    Obese female in no acute distress   (came back to room with otoscope to look in ear but patient off the floor with family)  Lungs:     respirations unlabored  Heart:    Normal heart rate. Normal rhythm. No murmurs, rubs, or gallops.   MS:   All extremities are intact.   Neurologic:   Awake, alert, oriented x 3. No apparent focal neurological           defect.     Data Reviewed:   I have personally reviewed following labs and imaging studies:  Labs: Labs show the following:   Basic Metabolic Panel: Recent Labs  Lab 07/16/20 1753 07/17/20 0456  NA 133* 135  K 3.3* 3.0*  CL 100 102  CO2 22 25  GLUCOSE 109* 93  BUN 6 7  CREATININE 0.71 0.74  CALCIUM 8.8* 8.4*   GFR Estimated Creatinine Clearance: 119.7 mL/min (by C-G formula based on SCr of 0.74 mg/dL). Liver Function Tests: Recent Labs  Lab 07/16/20 1753  AST 23  ALT 13  ALKPHOS 60  BILITOT 0.5  PROT 7.1  ALBUMIN 3.4*   No results for input(s): LIPASE, AMYLASE in the last 168 hours. No results for input(s): AMMONIA in the last 168 hours. Coagulation profile No results for input(s): INR, PROTIME in the last 168 hours.  CBC: Recent Labs  Lab 07/16/20 1753 07/17/20 0456  WBC 7.4 5.7  NEUTROABS 5.5  --   HGB 12.2 11.3*  HCT 35.9* 33.4*  MCV 96.2 97.9  PLT 184 175   Cardiac Enzymes: No results for input(s): CKTOTAL, CKMB, CKMBINDEX, TROPONINI in the last 168 hours. BNP (last 3 results) No results for input(s): PROBNP in the last 8760 hours. CBG: No results for input(s): GLUCAP in the last 168 hours. D-Dimer: No results for input(s): DDIMER in the last 72 hours. Hgb A1c: No results for input(s): HGBA1C in the last 72 hours. Lipid  Profile: No results for input(s): CHOL, HDL, LDLCALC, TRIG, CHOLHDL, LDLDIRECT in the last 72 hours. Thyroid function studies: No results for input(s): TSH, T4TOTAL, T3FREE, THYROIDAB in the last 72 hours.  Invalid input(s): FREET3 Anemia work up: No results for input(s): VITAMINB12, FOLATE, FERRITIN, TIBC, IRON, RETICCTPCT in the last 72 hours. Sepsis Labs: Recent Labs  Lab 07/16/20 1753 07/17/20 0456  WBC 7.4 5.7  LATICACIDVEN 0.8  --     Microbiology Recent Results (from the past 240 hour(s))  Respiratory Panel by RT PCR (Flu A&B, Covid) - Nasopharyngeal Swab     Status: None   Collection Time: 07/16/20  8:29 PM   Specimen: Nasopharyngeal Swab  Result Value Ref Range Status   SARS Coronavirus 2 by RT PCR NEGATIVE NEGATIVE Final    Comment: (NOTE) SARS-CoV-2 target nucleic acids are NOT DETECTED.  The SARS-CoV-2 RNA is generally detectable in upper respiratoy specimens during the acute phase of infection. The lowest concentration of SARS-CoV-2 viral copies this assay can detect is 131 copies/mL. A negative result does not preclude SARS-Cov-2 infection and should not be used as the sole basis for treatment or other patient management decisions. A negative result may occur with  improper specimen collection/handling, submission of specimen other than nasopharyngeal swab, presence of viral mutation(s) within the areas targeted by this assay, and inadequate number of viral copies (<131 copies/mL). A negative result must be combined with clinical observations, patient history, and epidemiological information. The expected result is Negative.  Fact Sheet for Patients:  https://www.moore.com/  Fact Sheet for Healthcare Providers:  https://www.young.biz/  This test is no t yet approved or cleared by the [place] FDA and  has been authorized for detection and/or diagnosis of SARS-CoV-2 by FDA under an Emergency Use Authorization  (EUA). This EUA will remain  in effect (meaning this test can be used) for the duration of the COVID-19 declaration under Section 564(b)(1) of the Act, 21 U.S.C. section 360bbb-3(b)(1), unless the authorization is terminated or revoked sooner.     Influenza A by PCR NEGATIVE NEGATIVE Final   Influenza B by PCR NEGATIVE NEGATIVE Final    Comment: (NOTE) The Xpert Xpress SARS-CoV-2/FLU/RSV assay is intended as an aid in  the diagnosis of influenza from Nasopharyngeal swab specimens and  should not be used as a sole basis for treatment. Nasal washings and  aspirates are unacceptable for Xpert Xpress SARS-CoV-2/FLU/RSV  testing.  Fact Sheet for Patients: https://www.moore.com/  Fact Sheet for Healthcare Providers: https://www.young.biz/  This test is not yet approved or cleared by the [place] FDA and  has been authorized for detection and/or diagnosis of SARS-CoV-2 by  FDA under an Emergency Use Authorization (EUA). This EUA will remain  in effect (meaning this test can be used) for the duration of the  Covid-19 declaration under Section 564(b)(1) of the Act, 21  U.S.C. section 360bbb-3(b)(1), unless the authorization is  terminated or revoked. Performed at [facility], [address], [place], [place] [address]     Procedures and diagnostic studies:  CT Maxillofacial W Contrast  Result Date: 07/16/2020 CLINICAL DATA:  Bilateral ear pain. Currently on antibiotics. Worsening ear pain. Evaluate for mastoiditis. Fever. EXAM: CT MAXILLOFACIAL WITH CONTRAST TECHNIQUE: Multidetector CT imaging of the maxillofacial structures was performed with intravenous contrast. Multiplanar CT image reconstructions were also generated. CONTRAST:  57mL OMNIPAQUE IOHEXOL 300 MG/ML  SOLN COMPARISON:  CT temporal bone 07/10/2019 FINDINGS: Osseous: Poor dentition with numerous caries. Caries and periapical lucency around upper and lower teeth on the left.  Orbits: No orbital mass edema or fluid collection. Normal globe bilaterally. Extraocular muscles and optic nerve normal bilaterally. Sinuses: Mild mucosal edema in the maxillary sinus bilaterally. Remaining sinuses clear. Hypoplastic frontal sinuses. Left mastoid effusion. Mild amount of soft tissue in the left middle ear lateral to the ossicles. No evidence of cholesteatoma or bone destruction. There is moderate skin thickening of the left external auditory canal. Overall these changes have improved since 2020 CT. Right mastoid sinus incompletely developed due to chronic mastoiditis, but clear. Mild soft tissue lateral to the malleus without erosion of the scutum. No change from the prior CT. Skin thickening in the right external auditory canal. Soft tissues: Reactive adenopathy in neck bilaterally. Right level 2 lymph node 11 mm. Left level 2 lymph node 10 mm. No soft tissue mass. Bilateral tonsilliths. Limited intracranial: Negative IMPRESSION: 1. Left mastoid effusion. Small amount of soft tissue thickening lateral to the ossicles on the left. Moderate thickening of the skin in the external canal. These findings are compatible with inflammation and have improved since the CT temporal bone 07/10/2019. 2. 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 Small amount of soft tissue thickening lateral to the ossicles unchanged from the prior CT temporal bone and 2020. 3. Poor dentition with multiple caries and periapical lucencies around upper and lower teeth on the left. Electronically Signed   By: Marlan Palau M.D.   On: 07/16/2020 20:13    Medications:   . neomycin-polymyxin-hydrocortisone  4 drop Right EAR QID   Continuous Infusions: . sodium chloride 75 mL/hr at 07/17/20 1122  . meropenem (MERREM) IV 2 g (07/17/20 1403)     LOS: 0 days   Joseph Art  [facility]   How to contact the [facility] Attending or Consulting provider 7A - 7P or covering provider during after hours 7P  -7A, for this patient?  1. Check the care team in [facility] and look for a) attending/consulting TRH provider listed and b) the [facility] team listed 2. Log into [email or web address] and use [facility]'s universal password to access. If you do not have the password, please contact the hospital operator. 3. Locate the [facility] provider you are looking for under [facility] and page to a number that you can be directly reached. 4. If you still have difficulty reaching the provider, please page the [facility] (Director on Call) for the  Hospitalists listed on amion for assistance.  07/17/2020, 2:35 PM

## 2020-07-17 NOTE — Plan of Care (Signed)
  Problem: Education: Goal: Knowledge of General Education information will improve Description: Including pain rating scale, medication(s)/side effects and non-pharmacologic comfort measures Outcome: Progressing   Problem: Health Behavior/Discharge Planning: Goal: Ability to manage health-related needs will improve Outcome: Progressing   Problem: Nutrition: Goal: Adequate nutrition will be maintained Outcome: Progressing   Problem: Coping: Goal: Level of anxiety will decrease Outcome: Progressing   Problem: Pain Managment: Goal: General experience of comfort will improve Outcome: Progressing Note: Still complaints of right ear pain, treated multiple times with pain medication   Problem: Safety: Goal: Ability to remain free from injury will improve Outcome: Progressing

## 2020-07-18 DIAGNOSIS — H60503 Unspecified acute noninfective otitis externa, bilateral: Secondary | ICD-10-CM

## 2020-07-18 LAB — CBC
HCT: 32.1 % — ABNORMAL LOW (ref 36.0–46.0)
Hemoglobin: 11.1 g/dL — ABNORMAL LOW (ref 12.0–15.0)
MCH: 33.6 pg (ref 26.0–34.0)
MCHC: 34.6 g/dL (ref 30.0–36.0)
MCV: 97.3 fL (ref 80.0–100.0)
Platelets: 193 10*3/uL (ref 150–400)
RBC: 3.3 MIL/uL — ABNORMAL LOW (ref 3.87–5.11)
RDW: 11.6 % (ref 11.5–15.5)
WBC: 5.5 10*3/uL (ref 4.0–10.5)
nRBC: 0 % (ref 0.0–0.2)

## 2020-07-18 LAB — BASIC METABOLIC PANEL
Anion gap: 7 (ref 5–15)
BUN: 5 mg/dL — ABNORMAL LOW (ref 6–20)
CO2: 24 mmol/L (ref 22–32)
Calcium: 8.7 mg/dL — ABNORMAL LOW (ref 8.9–10.3)
Chloride: 106 mmol/L (ref 98–111)
Creatinine, Ser: 0.62 mg/dL (ref 0.44–1.00)
GFR, Estimated: >60 mL/min (ref >60)
Glucose, Bld: 90 mg/dL (ref 70–99)
Potassium: 3.8 mmol/L (ref 3.5–5.1)
Sodium: 137 mmol/L (ref 135–145)

## 2020-07-18 MED ORDER — KETOROLAC TROMETHAMINE 30 MG/ML IJ SOLN
30.0000 mg | Freq: Four times a day (QID) | INTRAMUSCULAR | Status: DC | PRN
  Administered 2020-07-18 – 2020-07-19 (×3): 30 mg via INTRAVENOUS
  Filled 2020-07-18 (×3): qty 1

## 2020-07-18 NOTE — Progress Notes (Signed)
Progress Note    Peggy Hooper  BTD:[id number] DOB: 02-26-1991  DOA: 07/16/2020 PCP: Department, [facility]    Brief Narrative:     Medical records reviewed and are as summarized below:  Peggy Hooper is an 29 y.o. female with no significant past medical history has been experiencing bilateral ear pain last few days.  It started off in the left ear on the posterior aspect around the mastoid area about 5 days ago and 2 days later patient came to the ER was given eardrops and oral antibiotics despite taking which pain worsened and also start involving the right mastoid area.  No discharge seen from the ears.  Has been experiencing fever and chills temperatures are around 102 F at home.  Reportedly failed outpatient abx.     Assessment/Plan:   Principal Problem:   Mastoiditis Active Problems:   Hypokalemia   Ear infection    B/L mastoiditis -IV Abx for 24 more hours and then plan to switch to PO-- levaquin 750 mg daily? -needs referral to ENT outpatient (placed referral in Epic on 11/9) -pain control- avoid IV narcotics  Mild hypokalemia -repleted  obesity Body mass index is 30.54 kg/m.   Family Communication/Anticipated D/C date and plan/Code Status   DVT prophylaxis: scd Code Status: Full Code.  Disposition Plan: Status is: Inpatient  Remains inpatient appropriate because:IV treatments appropriate due to intensity of illness or inability to take PO   Dispo: The patient is from: Home              Anticipated d/c is to: Home              Anticipated d/c date is: 1 day              Patient currently is not medically stable to d/c.         Medical Consultants:    ENT (phone in ER)- I placed referral in Epic     Subjective:   Slow to improve  Objective:    Vitals:   07/17/20 1824 07/18/20 0015 07/18/20 0525 07/18/20 1047  BP: 118/72 112/70 110/69 133/84  Pulse: 82 69 71 76  Resp: 16 18 18 18   Temp: 97.9 F (36.6 C) 98.4 F  (36.9 C) 98.3 F (36.8 C) 97.7 F (36.5 C)  TempSrc: Oral Oral Oral Oral  SpO2: 100% 99% 98% 100%  Weight:      Height:        Intake/Output Summary (Last 24 hours) at 07/18/2020 1409 Last data filed at 07/18/2020 1100 Gross per 24 hour  Intake 680 ml  Output --  Net 680 ml   Filed Weights   07/16/20 1558  Weight: 88.5 kg    Exam:  General: Appearance:    Obese female in no acute distress     Lungs:     Clear to auscultation bilaterally, respirations unlabored  Heart:    Normal heart rate. Normal rhythm. No murmurs, rubs, or gallops.   MS:   All extremities are intact.   Neurologic:   Awake, alert, oriented x 3. No apparent focal neurological           defect.     Data Reviewed:   I have personally reviewed following labs and imaging studies:  Labs: Labs show the following:   Basic Metabolic Panel: Recent Labs  Lab 07/16/20 1753 07/16/20 1753 07/17/20 0456 07/18/20 0552  NA 133*  --  135 137  K 3.3*   < >  3.0* 3.8  CL 100  --  102 106  CO2 22  --  25 24  GLUCOSE 109*  --  93 90  BUN 6  --  7 5*  CREATININE 0.71  --  0.74 0.62  CALCIUM 8.8*  --  8.4* 8.7*   < > = values in this interval not displayed.   GFR Estimated Creatinine Clearance: 119.7 mL/min (by C-G formula based on SCr of 0.62 mg/dL). Liver Function Tests: Recent Labs  Lab 07/16/20 1753  AST 23  ALT 13  ALKPHOS 60  BILITOT 0.5  PROT 7.1  ALBUMIN 3.4*   No results for input(s): LIPASE, AMYLASE in the last 168 hours. No results for input(s): AMMONIA in the last 168 hours. Coagulation profile No results for input(s): INR, PROTIME in the last 168 hours.  CBC: Recent Labs  Lab 07/16/20 1753 07/17/20 0456 07/18/20 0552  WBC 7.4 5.7 5.5  NEUTROABS 5.5  --   --   HGB 12.2 11.3* 11.1*  HCT 35.9* 33.4* 32.1*  MCV 96.2 97.9 97.3  PLT 184 175 193   Cardiac Enzymes: No results for input(s): CKTOTAL, CKMB, CKMBINDEX, TROPONINI in the last 168 hours. BNP (last 3 results) No results  for input(s): PROBNP in the last 8760 hours. CBG: No results for input(s): GLUCAP in the last 168 hours. D-Dimer: No results for input(s): DDIMER in the last 72 hours. Hgb A1c: No results for input(s): HGBA1C in the last 72 hours. Lipid Profile: No results for input(s): CHOL, HDL, LDLCALC, TRIG, CHOLHDL, LDLDIRECT in the last 72 hours. Thyroid function studies: No results for input(s): TSH, T4TOTAL, T3FREE, THYROIDAB in the last 72 hours.  Invalid input(s): FREET3 Anemia work up: No results for input(s): VITAMINB12, FOLATE, FERRITIN, TIBC, IRON, RETICCTPCT in the last 72 hours. Sepsis Labs: Recent Labs  Lab 07/16/20 1753 07/17/20 0456 07/18/20 0552  WBC 7.4 5.7 5.5  LATICACIDVEN 0.8  --   --     Microbiology Recent Results (from the past 240 hour(s))  Respiratory Panel by RT PCR (Flu A&B, Covid) - Nasopharyngeal Swab     Status: None   Collection Time: 07/16/20  8:29 PM   Specimen: Nasopharyngeal Swab  Result Value Ref Range Status   SARS Coronavirus 2 by RT PCR NEGATIVE NEGATIVE Final    Comment: (NOTE) SARS-CoV-2 target nucleic acids are NOT DETECTED.  The SARS-CoV-2 RNA is generally detectable in upper respiratoy specimens during the acute phase of infection. The lowest concentration of SARS-CoV-2 viral copies this assay can detect is 131 copies/mL. A negative result does not preclude SARS-Cov-2 infection and should not be used as the sole basis for treatment or other patient management decisions. A negative result may occur with  improper specimen collection/handling, submission of specimen other than nasopharyngeal swab, presence of viral mutation(s) within the areas targeted by this assay, and inadequate number of viral copies (<131 copies/mL). A negative result must be combined with clinical observations, patient history, and epidemiological information. The expected result is Negative.  Fact Sheet for Patients:   https://www.moore.com/  Fact Sheet for Healthcare Providers:  https://www.young.biz/  This test is no t yet approved or cleared by the [place] FDA and  has been authorized for detection and/or diagnosis of SARS-CoV-2 by FDA under an Emergency Use Authorization (EUA). This EUA will remain  in effect (meaning this test can be used) for the duration of the COVID-19 declaration under Section 564(b)(1) of the Act, 21 U.S.C. section 360bbb-3(b)(1), unless the authorization is terminated  or revoked sooner.     Influenza A by PCR NEGATIVE NEGATIVE Final   Influenza B by PCR NEGATIVE NEGATIVE Final    Comment: (NOTE) The Xpert Xpress SARS-CoV-2/FLU/RSV assay is intended as an aid in  the diagnosis of influenza from Nasopharyngeal swab specimens and  should not be used as a sole basis for treatment. Nasal washings and  aspirates are unacceptable for Xpert Xpress SARS-CoV-2/FLU/RSV  testing.  Fact Sheet for Patients: https://www.moore.com/https://www.fda.gov/media/142436/download  Fact Sheet for Healthcare Providers: https://www.young.biz/https://www.fda.gov/media/142435/download  This test is not yet approved or cleared by the [place] FDA and  has been authorized for detection and/or diagnosis of SARS-CoV-2 by  FDA under an Emergency Use Authorization (EUA). This EUA will remain  in effect (meaning this test can be used) for the duration of the  Covid-19 declaration under Section 564(b)(1) of the Act, 21  U.S.C. section 360bbb-3(b)(1), unless the authorization is  terminated or revoked. Performed at [facility], [address] [place], [place] [address]     Procedures and diagnostic studies:  CT Maxillofacial W Contrast  Result Date: 07/16/2020 CLINICAL DATA:  Bilateral ear pain. Currently on antibiotics. Worsening ear pain. Evaluate for mastoiditis. Fever. EXAM: CT MAXILLOFACIAL WITH CONTRAST TECHNIQUE: Multidetector CT imaging of the maxillofacial structures was  performed with intravenous contrast. Multiplanar CT image reconstructions were also generated. CONTRAST:  75mL OMNIPAQUE IOHEXOL 300 MG/ML  SOLN COMPARISON:  CT temporal bone 07/10/2019 FINDINGS: Osseous: Poor dentition with numerous caries. Caries and periapical lucency around upper and lower teeth on the left. Orbits: No orbital mass edema or fluid collection. Normal globe bilaterally. Extraocular muscles and optic nerve normal bilaterally. Sinuses: Mild mucosal edema in the maxillary sinus bilaterally. Remaining sinuses clear. Hypoplastic frontal sinuses. Left mastoid effusion. Mild amount of soft tissue in the left middle ear lateral to the ossicles. No evidence of cholesteatoma or bone destruction. There is moderate skin thickening of the left external auditory canal. Overall these changes have improved since 2020 CT. Right mastoid sinus incompletely developed due to chronic mastoiditis, but clear. Mild soft tissue lateral to the malleus without erosion of the scutum. No change from the prior CT. Skin thickening in the right external auditory canal. Soft tissues: Reactive adenopathy in neck bilaterally. Right level 2 lymph node 11 mm. Left level 2 lymph node 10 mm. No soft tissue mass. Bilateral tonsilliths. Limited intracranial: Negative IMPRESSION: 1. Left mastoid effusion. Small amount of soft tissue thickening lateral to the ossicles on the left. Moderate thickening of the skin in the external canal. These findings are compatible with inflammation and have improved since the CT temporal bone 07/10/2019. 2. 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 Small amount of soft tissue thickening lateral to the ossicles unchanged from the prior CT temporal bone and 2020. 3. Poor dentition with multiple caries and periapical lucencies around upper and lower teeth on the left. Electronically Signed   By: Marlan Palauharles  Clark M.D.   On: 07/16/2020 20:13    Medications:   .  neomycin-polymyxin-hydrocortisone  4 drop Right EAR QID   Continuous Infusions: . meropenem (MERREM) IV 2 g (07/18/20 1309)     LOS: 1 day   Joseph ArtJessica U Jerrik Housholder  [facility]   How to contact the [facility] Attending or Consulting provider 7A - 7P or covering provider during after hours 7P -7A, for this patient?  1. Check the care team in [facility] and look for a) attending/consulting [facility] provider listed and b) the [facility] team listed  2. Log into [email or web address] and use Agency's universal password to access. If you do not have the password, please contact the hospital operator. 3. Locate the [facility] provider you are looking for under [facility] and page to a number that you can be directly reached. 4. If you still have difficulty reaching the provider, please page the [facility] (Director on Call) for the Hospitalists listed on amion for assistance.  07/18/2020, 2:09 PM

## 2020-07-19 LAB — BASIC METABOLIC PANEL
Anion gap: 6 (ref 5–15)
BUN: 7 mg/dL (ref 6–20)
CO2: 26 mmol/L (ref 22–32)
Calcium: 8.9 mg/dL (ref 8.9–10.3)
Chloride: 105 mmol/L (ref 98–111)
Creatinine, Ser: 0.69 mg/dL (ref 0.44–1.00)
GFR, Estimated: >60 mL/min (ref >60)
Glucose, Bld: 96 mg/dL (ref 70–99)
Potassium: 4 mmol/L (ref 3.5–5.1)
Sodium: 137 mmol/L (ref 135–145)

## 2020-07-19 MED ORDER — CEFDINIR 300 MG PO CAPS: 300.0000 mg | ORAL_CAPSULE | Freq: Two times a day (BID) | ORAL | 0 refills | Status: AC

## 2020-07-19 MED ORDER — TRAMADOL HCL 50 MG PO TABS: 50.0000 mg | ORAL_TABLET | Freq: Four times a day (QID) | ORAL | 0 refills | Status: AC | PRN

## 2020-07-19 NOTE — Discharge Summary (Signed)
Physician Discharge Summary  Peggy Hooper ZOX:[id number] DOB: Jan 09, 1991 DOA: 07/16/2020  [facility]  Admit date: 07/16/2020 Discharge date: 07/19/2020  Admitted From: Home  Disposition: Home  Recommendations for Outpatient Follow-up:  1. Follow up with PCP in 1-2 weeks 2. Follow-up with ENT in 1-2 weeks, ambulatory referral placed 3. Continue antibiotics with cefdinir 300 mg p.o. twice daily x10 days for mastoiditis 4. Continue eardrops previously prescribed 5. Tramadol as needed for pain control  Home Health: No Equipment/Devices: None  Discharge Condition: Stable CODE STATUS: Full code Diet recommendation: Regular diet  History of present illness:  Peggy Hooper is an 29 y.o. female withno significant past medical history has been experiencing bilateral ear pain last few days. It started off in the left ear on the posterior aspect around the mastoid area about 5 days ago and 2 days later patient came to the ER was given eardrops and oral antibiotics despite taking which pain worsened and also start involving the right mastoid area. No discharge seen from the ears. Has been experiencing fever and chills temperatures are around 102 F at home.  Reportedly failed outpatient abx.    ENT was consulted by EDP who recommended admission for IV antibiotics, no surgical indication at that time.  Hospital course:  Bilateral mastoiditis Patient representing to the ED with persistent ear pain despite home antibiotics with amoxicillin and eardrops.  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  ED provider discussed case with ENT on-call, Dr. Orvan Falconerampbell who recommended hospitalist admission for IV antibiotics for failed outpatient therapy; no surgical  intervention required.  Patient started on meropenem and completed 3-day course with resolution of symptoms, will discharge on cefdinir 300 mg p.o. twice daily for additional 10 days and to resume her home eardrops that were previously prescribed.  Patient will need follow-up with ENT in 1-2 weeks for further evaluation, ambulatory referral placed.  Hypokalemia Repleted during hospitalization.  Potassium 4.0 at time of discharge.  Obesity Body mass index is 30.54 kg/m.  Discussed with patient needs for aggressive weight loss/lifestyle changes complicates all facets of care.   Discharge Diagnoses:  Principal Problem:   Mastoiditis Active Problems:   Hypokalemia   Ear infection    Discharge Instructions  Discharge Instructions    Ambulatory referral to ENT   Complete by: As directed    Call MD for:  difficulty breathing, headache or visual disturbances   Complete by: As directed    Call MD for:  extreme fatigue   Complete by: As directed    Call MD for:  persistant dizziness or light-headedness   Complete by: As directed    Call MD for:  persistant nausea and vomiting   Complete by: As directed    Call MD for:  severe uncontrolled pain   Complete by: As directed    Call MD for:  temperature >100.4   Complete by: As directed    Diet - low sodium heart healthy   Complete by: As directed    Increase activity slowly   Complete by: As directed      Allergies as of 07/19/2020   No Known Allergies     Medication List    STOP taking these medications   amoxicillin 875 MG tablet Commonly known as: AMOXIL     TAKE these  medications   acetaminophen 500 MG tablet Commonly known as: TYLENOL Take 1,500 mg by mouth every 6 (six) hours as needed for moderate pain.   cefdinir 300 MG capsule Commonly known as: OMNICEF Take 1 capsule (300 mg total) by mouth 2 (two) times daily for 10 days.   ciprofloxacin-dexamethasone OTIC suspension Commonly known as: CIPRODEX Place 4  drops into both ears 2 (two) times daily.   neomycin-polymyxin-hydrocortisone 3.5-10000-1 OTIC suspension Commonly known as: CORTISPORIN Place 4 drops into both ears 4 (four) times daily. Apply 4 times daily for 7 days What changed: additional instructions   traMADol 50 MG tablet Commonly known as: ULTRAM Take 1 tablet (50 mg total) by mouth every 6 (six) hours as needed for up to 5 days for moderate pain.       Follow-up Information    Newman Pies, MD. Schedule an appointment as soon as possible for a visit in 1 week(s).   Specialty: Otolaryngology Contact information: [address] [place] [address] [place] [address] [phone]        Department, [facility]. Schedule an appointment as soon as possible for a visit in 1 week(s).   Contact information: [address] [place] [address] [phone]              No Known Allergies  Consultations:  EDP discussed with ENT on-call, Dr. Orvan Falconer at time of admission   Procedures/Studies: CT Maxillofacial W Contrast  Result Date: 07/16/2020 CLINICAL DATA:  Bilateral ear pain. Currently on antibiotics. Worsening ear pain. Evaluate for mastoiditis. Fever. EXAM: CT MAXILLOFACIAL WITH CONTRAST TECHNIQUE: Multidetector CT imaging of the maxillofacial structures was performed with intravenous contrast. Multiplanar CT image reconstructions were also generated. CONTRAST:  70mL OMNIPAQUE IOHEXOL 300 MG/ML  SOLN COMPARISON:  CT temporal bone 07/10/2019 FINDINGS: Osseous: Poor dentition with numerous caries. Caries and periapical lucency around upper and lower teeth on the left. Orbits: No orbital mass edema or fluid collection. Normal globe bilaterally. Extraocular muscles and optic nerve normal bilaterally. Sinuses: Mild mucosal edema in the maxillary sinus bilaterally. Remaining sinuses clear. Hypoplastic frontal sinuses. Left mastoid effusion. Mild amount of soft tissue in the left middle ear lateral to the ossicles. No  evidence of cholesteatoma or bone destruction. There is moderate skin thickening of the left external auditory canal. Overall these changes have improved since 2020 CT. Right mastoid sinus incompletely developed due to chronic mastoiditis, but clear. Mild soft tissue lateral to the malleus without erosion of the scutum. No change from the prior CT. Skin thickening in the right external auditory canal. Soft tissues: Reactive adenopathy in neck bilaterally. Right level 2 lymph node 11 mm. Left level 2 lymph node 10 mm. No soft tissue mass. Bilateral tonsilliths. Limited intracranial: Negative IMPRESSION: 1. Left mastoid effusion. Small amount of soft tissue thickening lateral to the ossicles on the left. Moderate thickening of the skin in the external canal. These findings are compatible with inflammation and have improved since the CT temporal bone 07/10/2019. 2. 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 Small amount of soft tissue thickening lateral to the ossicles unchanged from the prior CT temporal bone and 2020. 3. Poor dentition with multiple caries and periapical lucencies around upper and lower teeth on the left. Electronically Signed   By: Marlan Palau M.D.   On: 07/16/2020 20:13      Subjective: Patient seen and examined bedside, resting comfortably.  States pain has resolved and ready for discharge home.  No other  complaints or concerns at this time.  Denies headache, no visual changes, no chest pain, no palpitations, no shortness of breath, no abdominal pain.  No acute events overnight per nursing staff.  Discharge Exam: Vitals:   07/18/20 2156 07/19/20 0546  BP: 129/85 124/76  Pulse: 71 70  Resp: 15 14  Temp: 98.3 F (36.8 C) 98.2 F (36.8 C)  SpO2: 100% 100%   Vitals:   07/18/20 1047 07/18/20 1641 07/18/20 2156 07/19/20 0546  BP: 133/84 129/81 129/85 124/76  Pulse: 76 76 71 70  Resp: 18 16 15 14   Temp: 97.7 F (36.5 C) 98.9 F (37.2 C) 98.3 F (36.8 C)  98.2 F (36.8 C)  TempSrc: Oral Oral Oral Oral  SpO2: 100% 100% 100% 100%  Weight:      Height:        General: Pt is alert, awake, not in acute distress Cardiovascular: RRR, S1/S2 +, no rubs, no gallops Respiratory: CTA bilaterally, no wheezing, no rhonchi Abdominal: Soft, NT, ND, bowel sounds + Extremities: no edema, no cyanosis    The results of significant diagnostics from this hospitalization (including imaging, microbiology, ancillary and laboratory) are listed below for reference.     Microbiology: Recent Results (from the past 240 hour(s))  Respiratory Panel by RT PCR (Flu A&B, Covid) - Nasopharyngeal Swab     Status: None   Collection Time: 07/16/20  8:29 PM   Specimen: Nasopharyngeal Swab  Result Value Ref Range Status   SARS Coronavirus 2 by RT PCR NEGATIVE NEGATIVE Final    Comment: (NOTE) SARS-CoV-2 target nucleic acids are NOT DETECTED.  The SARS-CoV-2 RNA is generally detectable in upper respiratoy specimens during the acute phase of infection. The lowest concentration of SARS-CoV-2 viral copies this assay can detect is 131 copies/mL. A negative result does not preclude SARS-Cov-2 infection and should not be used as the sole basis for treatment or other patient management decisions. A negative result may occur with  improper specimen collection/handling, submission of specimen other than nasopharyngeal swab, presence of viral mutation(s) within the areas targeted by this assay, and inadequate number of viral copies (<131 copies/mL). A negative result must be combined with clinical observations, patient history, and epidemiological information. The expected result is Negative.  Fact Sheet for Patients:  13/07/21  Fact Sheet for Healthcare Providers:  https://www.moore.com/  This test is no t yet approved or cleared by the https://www.young.biz/ FDA and  has been authorized for detection and/or diagnosis of  SARS-CoV-2 by FDA under an Emergency Use Authorization (EUA). This EUA will remain  in effect (meaning this test can be used) for the duration of the COVID-19 declaration under Section 564(b)(1) of the Act, 21 U.S.C. section 360bbb-3(b)(1), unless the authorization is terminated or revoked sooner.     Influenza A by PCR NEGATIVE NEGATIVE Final   Influenza B by PCR NEGATIVE NEGATIVE Final    Comment: (NOTE) The Xpert Xpress SARS-CoV-2/FLU/RSV assay is intended as an aid in  the diagnosis of influenza from Nasopharyngeal swab specimens and  should not be used as a sole basis for treatment. Nasal washings and  aspirates are unacceptable for Xpert Xpress SARS-CoV-2/FLU/RSV  testing.  Fact Sheet for Patients: [place]  Fact Sheet for Healthcare Providers: https://www.moore.com/  This test is not yet approved or cleared by the https://www.young.biz/ FDA and  has been authorized for detection and/or diagnosis of SARS-CoV-2 by  FDA under an Emergency Use Authorization (EUA). This EUA will remain  in effect (meaning this test  can be used) for the duration of the  Covid-19 declaration under Section 564(b)(1) of the Act, 21  U.S.C. section 360bbb-3(b)(1), unless the authorization is  terminated or revoked. Performed at [facility], [address], [place], [place] [address]      Labs: BNP (last 3 results) No results for input(s): BNP in the last 8760 hours. Basic Metabolic Panel: Recent Labs  Lab 07/16/20 1753 07/17/20 0456 07/18/20 0552 07/19/20 0440  NA 133* 135 137 137  K 3.3* 3.0* 3.8 4.0  CL 100 102 106 105  CO2 22 25 24 26   GLUCOSE 109* 93 90 96  BUN 6 7 5* 7  CREATININE 0.71 0.74 0.62 0.69  CALCIUM 8.8* 8.4* 8.7* 8.9   Liver Function Tests: Recent Labs  Lab 07/16/20 1753  AST 23  ALT 13  ALKPHOS 60  BILITOT 0.5  PROT 7.1  ALBUMIN 3.4*   No results for input(s): LIPASE, AMYLASE in the last 168 hours. No  results for input(s): AMMONIA in the last 168 hours. CBC: Recent Labs  Lab 07/16/20 1753 07/17/20 0456 07/18/20 0552  WBC 7.4 5.7 5.5  NEUTROABS 5.5  --   --   HGB 12.2 11.3* 11.1*  HCT 35.9* 33.4* 32.1*  MCV 96.2 97.9 97.3  PLT 184 175 193   Cardiac Enzymes: No results for input(s): CKTOTAL, CKMB, CKMBINDEX, TROPONINI in the last 168 hours. BNP: Invalid input(s): POCBNP CBG: No results for input(s): GLUCAP in the last 168 hours. D-Dimer No results for input(s): DDIMER in the last 72 hours. Hgb A1c No results for input(s): HGBA1C in the last 72 hours. Lipid Profile No results for input(s): CHOL, HDL, LDLCALC, TRIG, CHOLHDL, LDLDIRECT in the last 72 hours. Thyroid function studies No results for input(s): TSH, T4TOTAL, T3FREE, THYROIDAB in the last 72 hours.  Invalid input(s): FREET3 Anemia work up No results for input(s): VITAMINB12, FOLATE, FERRITIN, TIBC, IRON, RETICCTPCT in the last 72 hours. Urinalysis    Component Value Date/Time   COLORURINE YELLOW 07/16/2020 2000   APPEARANCEUR CLEAR 07/16/2020 2000   LABSPEC >1.046 (H) 07/16/2020 2000   PHURINE 5.0 07/16/2020 2000   GLUCOSEU NEGATIVE 07/16/2020 2000   HGBUR NEGATIVE 07/16/2020 2000   BILIRUBINUR NEGATIVE 07/16/2020 2000   KETONESUR 20 (A) 07/16/2020 2000   PROTEINUR NEGATIVE 07/16/2020 2000   NITRITE NEGATIVE 07/16/2020 2000   LEUKOCYTESUR NEGATIVE 07/16/2020 2000   Sepsis Labs Invalid input(s): PROCALCITONIN,  WBC,  LACTICIDVEN Microbiology Recent Results (from the past 240 hour(s))  Respiratory Panel by RT PCR (Flu A&B, Covid) - Nasopharyngeal Swab     Status: None   Collection Time: 07/16/20  8:29 PM   Specimen: Nasopharyngeal Swab  Result Value Ref Range Status   SARS Coronavirus 2 by RT PCR NEGATIVE NEGATIVE Final    Comment: (NOTE) SARS-CoV-2 target nucleic acids are NOT DETECTED.  The SARS-CoV-2 RNA is generally detectable in upper respiratoy specimens during the acute phase of infection. The  lowest concentration of SARS-CoV-2 viral copies this assay can detect is 131 copies/mL. A negative result does not preclude SARS-Cov-2 infection and should not be used as the sole basis for treatment or other patient management decisions. A negative result may occur with  improper specimen collection/handling, submission of specimen other than nasopharyngeal swab, presence of viral mutation(s) within the areas targeted by this assay, and inadequate number of viral copies (<131 copies/mL). A negative result must be combined with clinical observations, patient history, and epidemiological information. The expected result is Negative.  Fact Sheet for Patients:  https://www.moore.com/  Fact Sheet for Healthcare Providers:  https://www.young.biz/  This test is no t yet approved or cleared by the [place] FDA and  has been authorized for detection and/or diagnosis of SARS-CoV-2 by FDA under an Emergency Use Authorization (EUA). This EUA will remain  in effect (meaning this test can be used) for the duration of the COVID-19 declaration under Section 564(b)(1) of the Act, 21 U.S.C. section 360bbb-3(b)(1), unless the authorization is terminated or revoked sooner.     Influenza A by PCR NEGATIVE NEGATIVE Final   Influenza B by PCR NEGATIVE NEGATIVE Final    Comment: (NOTE) The Xpert Xpress SARS-CoV-2/FLU/RSV assay is intended as an aid in  the diagnosis of influenza from Nasopharyngeal swab specimens and  should not be used as a sole basis for treatment. Nasal washings and  aspirates are unacceptable for Xpert Xpress SARS-CoV-2/FLU/RSV  testing.  Fact Sheet for Patients: https://www.moore.com/  Fact Sheet for Healthcare Providers: https://www.young.biz/  This test is not yet approved or cleared by the [place] FDA and  has been authorized for detection and/or diagnosis of SARS-CoV-2 by  FDA under  an Emergency Use Authorization (EUA). This EUA will remain  in effect (meaning this test can be used) for the duration of the  Covid-19 declaration under Section 564(b)(1) of the Act, 21  U.S.C. section 360bbb-3(b)(1), unless the authorization is  terminated or revoked. Performed at [facility], [address] [place], [place] [address]      Time coordinating discharge: Over 30 minutes  SIGNED:   Alvira Philips [place], DO  [facility] 07/19/2020, 10:24 AM

## 2020-07-19 NOTE — Progress Notes (Signed)
Pharmacy Antibiotic Note  Peggy Hooper is a 29 y.o. female admitted on 07/16/2020 with mastoiditis.  Pharmacy has been consulted for meropenem dosing.  Plan: Continue meropenem at current dose until deescalate to PO (today?) Monitor renal function.   Height: 5\' 7"  (170.2 cm) Weight: 88.5 kg (195 lb) IBW/kg (Calculated) : 61.6  Temp (24hrs), Avg:98.3 F (36.8 C), Min:97.7 F (36.5 C), Max:98.9 F (37.2 C)  Recent Labs  Lab 07/16/20 1753 07/17/20 0456 07/18/20 0552 07/19/20 0440  WBC 7.4 5.7 5.5  --   CREATININE 0.71 0.74 0.62 0.69  LATICACIDVEN 0.8  --   --   --     Estimated Creatinine Clearance: 119.7 mL/min (by C-G formula based on SCr of 0.69 mg/dL).    No Known Allergies  Antimicrobials this admission: Meropenem 11/7>   Dose adjustments this admission:  Microbiology results:   Peggy Hooper A. 08-16-1983, PharmD, BCPS, FNKF Clinical Pharmacist [place] Please utilize Amion for appropriate phone number to reach the unit pharmacist [facility])   07/19/2020 8:43 AM

## 2022-02-27 ENCOUNTER — Telehealth (HOSPITAL_COMMUNITY): Payer: Self-pay | Admitting: Professional

## 2022-02-27 ENCOUNTER — Ambulatory Visit (HOSPITAL_COMMUNITY): Payer: Medicaid Other

## 2022-02-27 ENCOUNTER — Ambulatory Visit (HOSPITAL_COMMUNITY): Payer: Self-pay | Admitting: Physician Assistant

## 2022-02-27 ENCOUNTER — Ambulatory Visit (HOSPITAL_COMMUNITY)
Admission: EM | Admit: 2022-02-27 | Discharge: 2022-02-27 | Disposition: A | Discharge status: Home / Self Care | Payer: Medicaid Other | Attending: Family | Admitting: Family

## 2022-02-27 DIAGNOSIS — F4323 Adjustment disorder with mixed anxiety and depressed mood: Secondary | ICD-10-CM | POA: Insufficient documentation

## 2022-02-27 DIAGNOSIS — Z9151 Personal history of suicidal behavior: Secondary | ICD-10-CM | POA: Insufficient documentation

## 2022-02-27 DIAGNOSIS — Z79899 Other long term (current) drug therapy: Secondary | ICD-10-CM | POA: Insufficient documentation

## 2022-02-27 MED ORDER — HYDROXYZINE HCL 10 MG PO TABS: 10.0000 mg | ORAL_TABLET | Freq: Two times a day (BID) | ORAL | 0 refills | Status: DC | PRN

## 2022-02-27 NOTE — Discharge Instructions (Addendum)
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
## 2022-02-28 ENCOUNTER — Ambulatory Visit (HOSPITAL_COMMUNITY): Payer: Medicaid Other | Admitting: Professional

## 2022-10-23 IMAGING — CT CT MAXILLOFACIAL W/ CM
3 of 4 series · 15 of 47 positions shown, 18 images · IV contrast (APPLIED)
Comparison: CT temporal bone 07/10/2019

CLINICAL DATA: Bilateral ear pain. Currently on antibiotics.
Worsening ear pain. Evaluate for mastoiditis. Fever.

EXAM:
CT MAXILLOFACIAL WITH CONTRAST
TECHNIQUE: Multidetector CT imaging of the maxillofacial structures was
performed with intravenous contrast. Multiplanar CT image
reconstructions were also generated.
CONTRAST:  75mL OMNIPAQUE IOHEXOL 300 MG/ML  SOLN

[Series 8: sagittal soft tissue · sagittal · 0.30mm/px · 3 of 84 slices shown]
[im 28/84  bone]
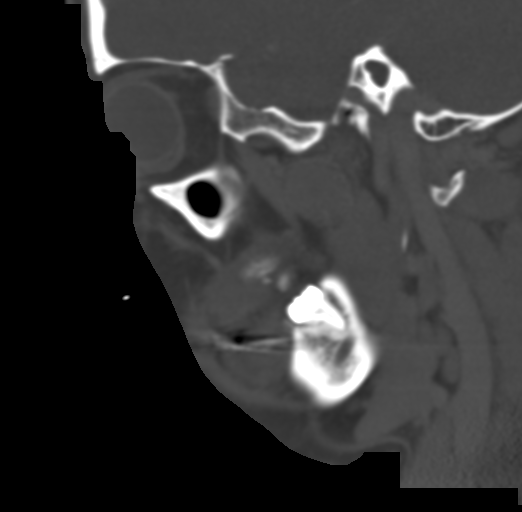
[im 42/84  bone]
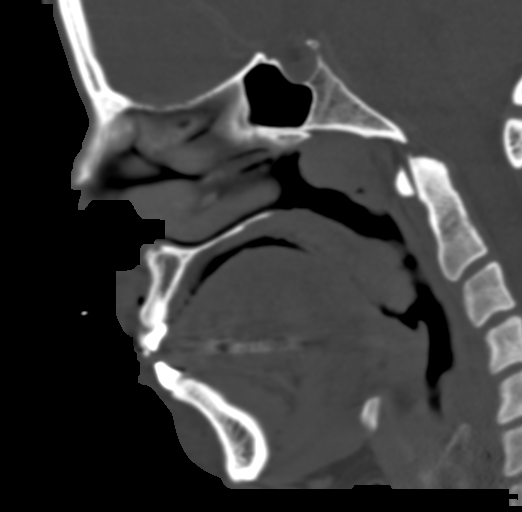
[im 56/84  bone]
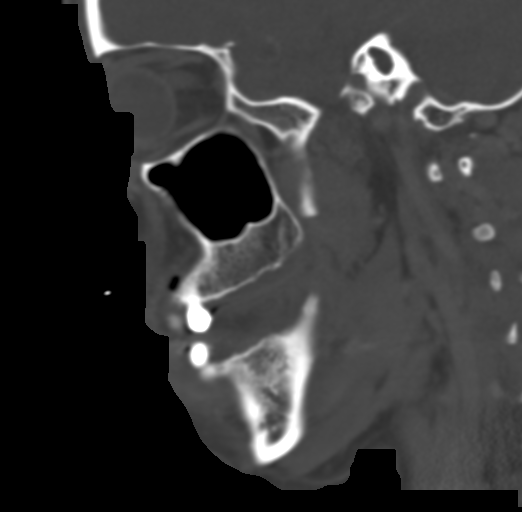

[Series 11: facial/orbits w 2.0 st · axial · 0.35mm/px · z∈[-220,-90]mm · 9 of 77 slices shown, 12 images]
[im 6/77  brain]
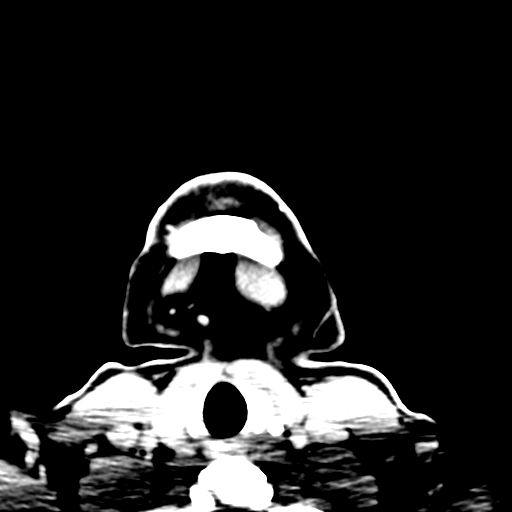
[im 6/77  bone]
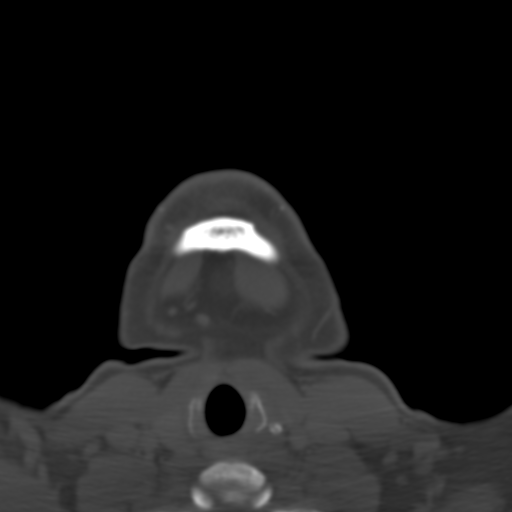
[im 14/77  bone]
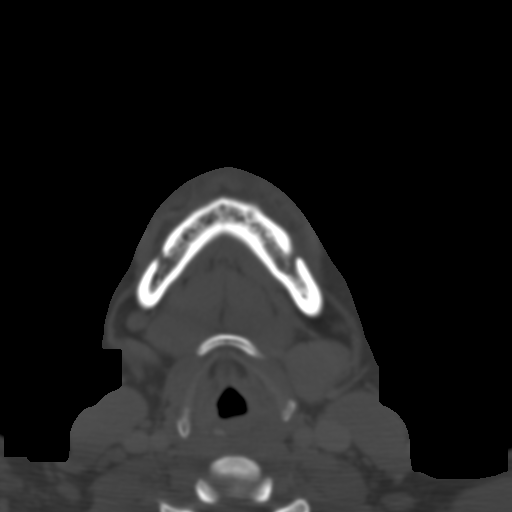
[im 21/77  bone]
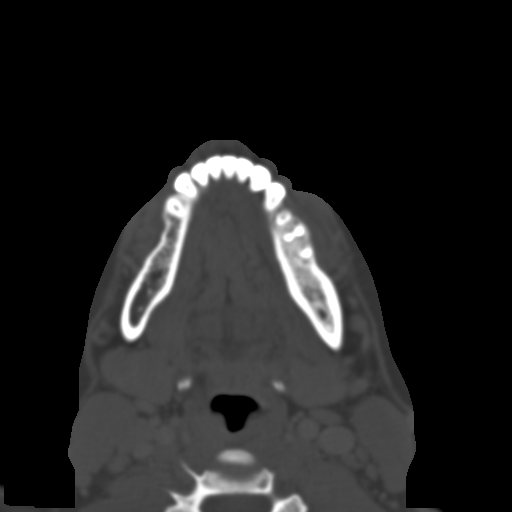
[im 29/77  bone]
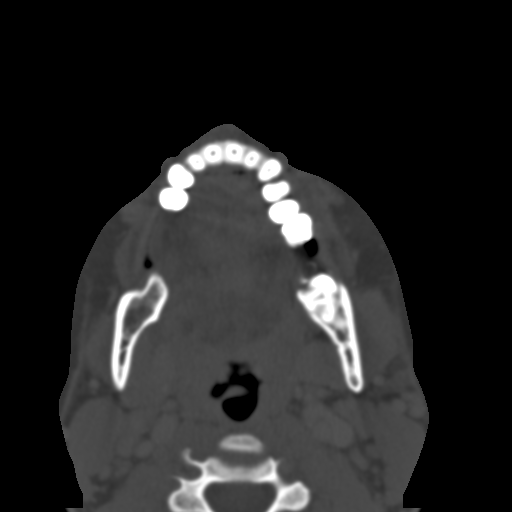
[im 40/77  brain]
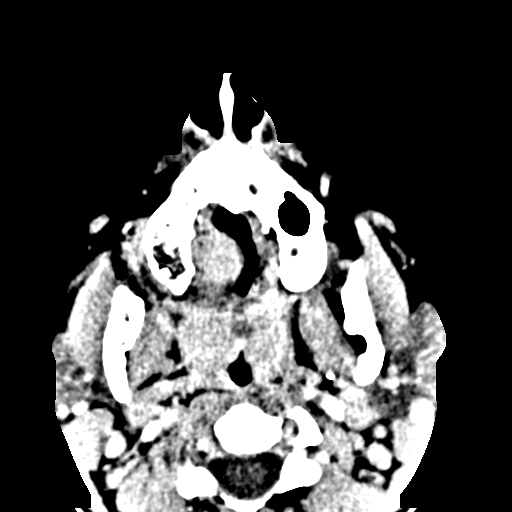
[im 40/77  bone]
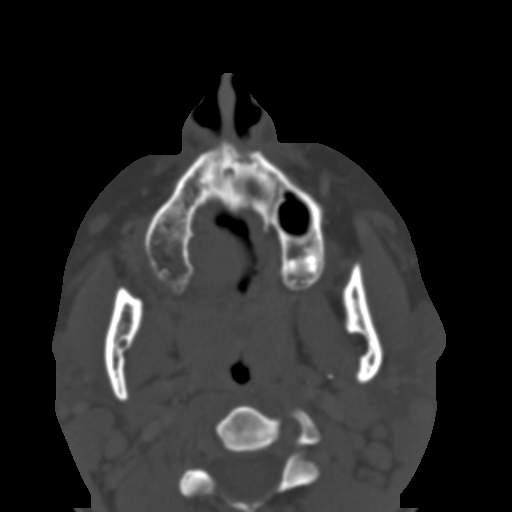
[im 48/77  bone]
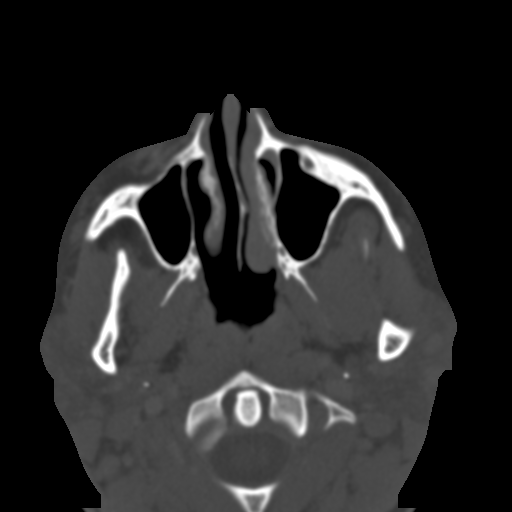
[im 56/77  bone]
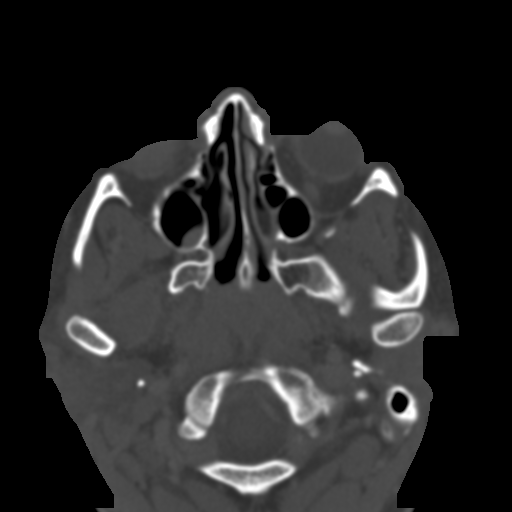
[im 63/77  bone]
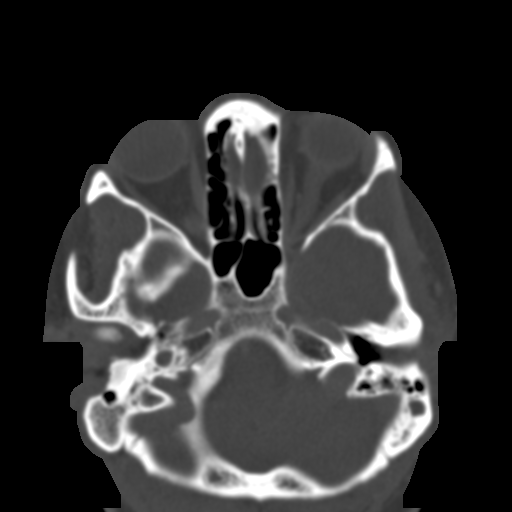
[im 71/77  brain]
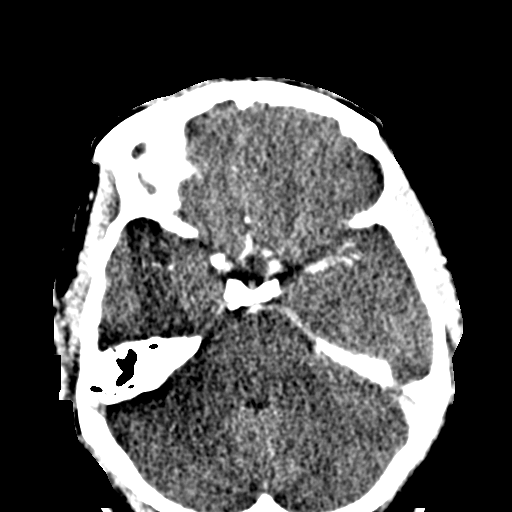
[im 71/77  bone]
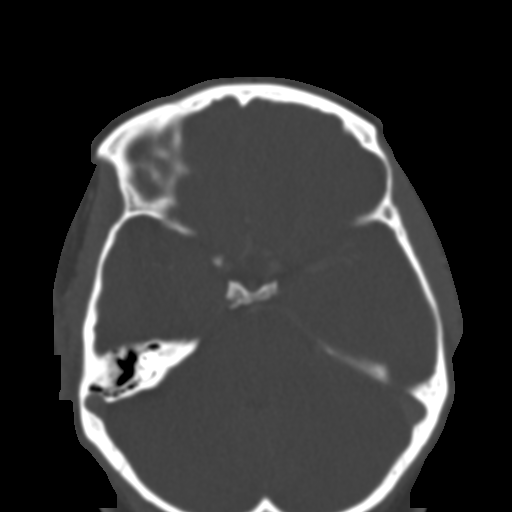

[Series 12: coronal bone · coronal · 0.31mm/px · 3 of 85 slices shown]
[im 22/85  bone]
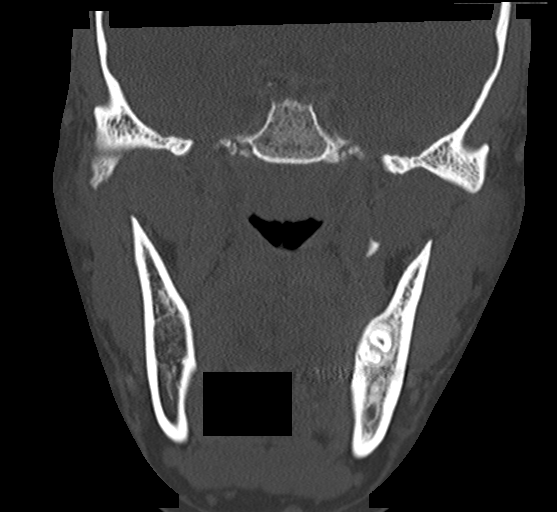
[im 43/85  bone]
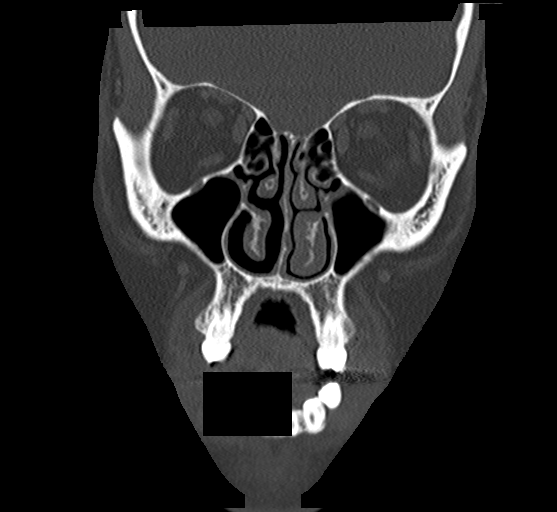
[im 64/85  bone]
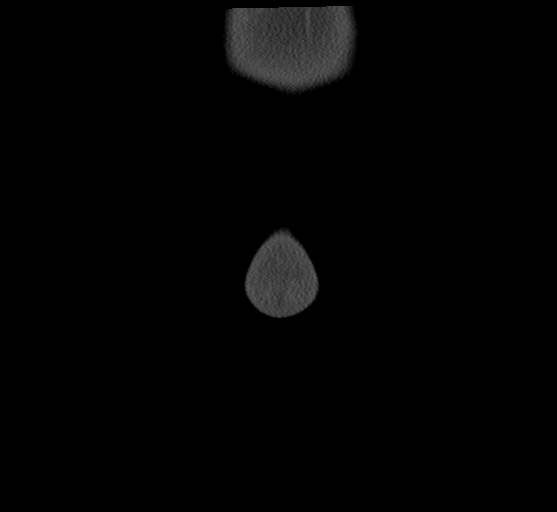

[15 of 47 positions shown; findings below may reference images not displayed]

FINDINGS: Osseous: Poor dentition with numerous caries. Caries and periapical
lucency around upper and lower teeth on the left.

Orbits: No orbital mass edema or fluid collection. Normal globe
bilaterally. Extraocular muscles and optic nerve normal bilaterally.

Sinuses: Mild mucosal edema in the maxillary sinus bilaterally.
Remaining sinuses clear. Hypoplastic frontal sinuses.

Left mastoid effusion. Mild amount of soft tissue in the left middle
ear lateral to the ossicles. No evidence of cholesteatoma or bone
destruction. There is moderate skin thickening of the left external
auditory canal. Overall these changes have improved since 6363 CT.

Right mastoid sinus incompletely developed due to chronic
mastoiditis, but clear. Mild soft tissue lateral to the malleus
without erosion of the scutum. No change from the prior CT. Skin
thickening in the right external auditory canal.

Soft tissues: Reactive adenopathy in neck bilaterally. Right level 2
lymph node 11 mm. Left level 2 lymph node 10 mm. No soft tissue
mass. Bilateral tonsilliths.

Limited intracranial: Negative
IMPRESSION: 1. Left mastoid effusion. Small amount of soft tissue thickening
lateral to the ossicles on the left. Moderate thickening of the skin
in the external canal. These findings are compatible with
inflammation and have improved since the CT temporal bone
07/10/2019.
2. Chronic mastoiditis on the right with mouth mastoid effusion.
Small amount of soft tissue thickening lateral to the ossicles
unchanged from the prior CT temporal bone and 6363.
3. Poor dentition with multiple caries and periapical lucencies
around upper and lower teeth on the left.

## 2023-07-13 ENCOUNTER — Emergency Department (HOSPITAL_COMMUNITY): Payer: 59

## 2023-07-13 ENCOUNTER — Other Ambulatory Visit: Payer: Self-pay

## 2023-07-13 ENCOUNTER — Encounter (HOSPITAL_COMMUNITY): Payer: Self-pay

## 2023-07-13 ENCOUNTER — Emergency Department (HOSPITAL_COMMUNITY)
Admission: EM | Admit: 2023-07-13 | Discharge: 2023-07-13 | Disposition: A | Discharge status: Home / Self Care | Payer: 59 | Attending: Emergency Medicine | Admitting: Emergency Medicine

## 2023-07-13 DIAGNOSIS — F419 Anxiety disorder, unspecified: Secondary | ICD-10-CM | POA: Diagnosis not present

## 2023-07-13 DIAGNOSIS — R531 Weakness: Secondary | ICD-10-CM | POA: Diagnosis not present

## 2023-07-13 DIAGNOSIS — T43625A Adverse effect of amphetamines, initial encounter: Secondary | ICD-10-CM | POA: Insufficient documentation

## 2023-07-13 DIAGNOSIS — R0602 Shortness of breath: Secondary | ICD-10-CM | POA: Diagnosis not present

## 2023-07-13 DIAGNOSIS — R000 Tachycardia, unspecified: Secondary | ICD-10-CM | POA: Diagnosis not present

## 2023-07-13 LAB — URINALYSIS, ROUTINE W REFLEX MICROSCOPIC
Bacteria, UA: NONE SEEN
Bilirubin Urine: NEGATIVE
Glucose, UA: NEGATIVE mg/dL
Hgb urine dipstick: NEGATIVE
Ketones, ur: NEGATIVE mg/dL
Leukocytes,Ua: NEGATIVE
Nitrite: NEGATIVE
Protein, ur: 30 mg/dL — AB
Specific Gravity, Urine: 1.005 (ref 1.005–1.030)
pH: 6 (ref 5.0–8.0)

## 2023-07-13 LAB — CBC WITH DIFFERENTIAL/PLATELET
Abs Immature Granulocytes: 0.01 10*3/uL (ref 0.00–0.07)
Basophils Absolute: 0.1 10*3/uL (ref 0.0–0.1)
Basophils Relative: 1 %
Eosinophils Absolute: 0 10*3/uL (ref 0.0–0.5)
Eosinophils Relative: 0 %
HCT: 38.3 % (ref 36.0–46.0)
Hemoglobin: 13.1 g/dL (ref 12.0–15.0)
Immature Granulocytes: 0 %
Lymphocytes Relative: 31 %
Lymphs Abs: 1.7 10*3/uL (ref 0.7–4.0)
MCH: 32.7 pg (ref 26.0–34.0)
MCHC: 34.2 g/dL (ref 30.0–36.0)
MCV: 95.5 fL (ref 80.0–100.0)
Monocytes Absolute: 0.3 10*3/uL (ref 0.1–1.0)
Monocytes Relative: 5 %
Neutro Abs: 3.4 10*3/uL (ref 1.7–7.7)
Neutrophils Relative %: 63 %
Platelets: 242 10*3/uL (ref 150–400)
RBC: 4.01 MIL/uL (ref 3.87–5.11)
RDW: 12.1 % (ref 11.5–15.5)
WBC: 5.4 10*3/uL (ref 4.0–10.5)
nRBC: 0 % (ref 0.0–0.2)

## 2023-07-13 LAB — RAPID URINE DRUG SCREEN, HOSP PERFORMED
Amphetamines: POSITIVE — AB
Barbiturates: NOT DETECTED
Benzodiazepines: NOT DETECTED
Cocaine: NOT DETECTED
Opiates: NOT DETECTED
Tetrahydrocannabinol: POSITIVE — AB

## 2023-07-13 LAB — COMPREHENSIVE METABOLIC PANEL
ALT: 16 U/L (ref 0–44)
AST: 22 U/L (ref 15–41)
Albumin: 4.1 g/dL (ref 3.5–5.0)
Alkaline Phosphatase: 64 U/L (ref 38–126)
Anion gap: 9 (ref 5–15)
BUN: 6 mg/dL (ref 6–20)
CO2: 27 mmol/L (ref 22–32)
Calcium: 9.5 mg/dL (ref 8.9–10.3)
Chloride: 101 mmol/L (ref 98–111)
Creatinine, Ser: 0.92 mg/dL (ref 0.44–1.00)
GFR, Estimated: >60 mL/min (ref >60)
Glucose, Bld: 120 mg/dL — ABNORMAL HIGH (ref 70–99)
Potassium: 3.4 mmol/L — ABNORMAL LOW (ref 3.5–5.1)
Sodium: 137 mmol/L (ref 135–145)
Total Bilirubin: 0.9 mg/dL (ref 0.3–1.2)
Total Protein: 7.6 g/dL (ref 6.5–8.1)

## 2023-07-13 LAB — CBG MONITORING, ED: Glucose-Capillary: 103 mg/dL — ABNORMAL HIGH (ref 70–99)

## 2023-07-13 LAB — ETHANOL: Alcohol, Ethyl (B): <10 mg/dL (ref <10)

## 2023-07-13 LAB — SALICYLATE LEVEL: Salicylate Lvl: <7 mg/dL — ABNORMAL LOW (ref 7.0–30.0)

## 2023-07-13 LAB — ACETAMINOPHEN LEVEL: Acetaminophen (Tylenol), Serum: <10 ug/mL — ABNORMAL LOW (ref 10–30)

## 2023-07-13 LAB — HCG, SERUM, QUALITATIVE: Preg, Serum: NEGATIVE

## 2023-07-13 MED ORDER — SODIUM CHLORIDE 0.9 % IV BOLUS
1000.0000 mL | Freq: Once | INTRAVENOUS | Status: AC
  Administered 2023-07-13: 1000 mL via INTRAVENOUS

## 2023-07-13 MED ORDER — LORAZEPAM 2 MG/ML IJ SOLN
1.0000 mg | Freq: Once | INTRAMUSCULAR | Status: AC
  Administered 2023-07-13: 1 mg via INTRAVENOUS
  Filled 2023-07-13: qty 1

## 2023-07-13 NOTE — ED Notes (Signed)
IV removed from R FA.
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
## 2023-07-13 NOTE — Discharge Instructions (Signed)
You have amphetamine in your system that is likely causing your symptoms.  Please stay hydrated.  See your doctor for follow-up  Return to ER if you have worse palpitations or chest pain or shortness of breath or vomiting
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
## 2023-07-28 ENCOUNTER — Emergency Department (HOSPITAL_COMMUNITY): Payer: 59

## 2023-07-28 ENCOUNTER — Emergency Department (HOSPITAL_COMMUNITY)
Admission: EM | Admit: 2023-07-28 | Discharge: 2023-07-28 | Disposition: A | Discharge status: Home / Self Care | Payer: 59 | Attending: Emergency Medicine | Admitting: Emergency Medicine

## 2023-07-28 ENCOUNTER — Encounter (HOSPITAL_BASED_OUTPATIENT_CLINIC_OR_DEPARTMENT_OTHER): Payer: Self-pay | Admitting: Otolaryngology

## 2023-07-28 ENCOUNTER — Other Ambulatory Visit: Payer: Self-pay | Admitting: Otolaryngology

## 2023-07-28 DIAGNOSIS — S299XXA Unspecified injury of thorax, initial encounter: Secondary | ICD-10-CM | POA: Insufficient documentation

## 2023-07-28 DIAGNOSIS — S3991XA Unspecified injury of abdomen, initial encounter: Secondary | ICD-10-CM | POA: Diagnosis not present

## 2023-07-28 DIAGNOSIS — S161XXA Strain of muscle, fascia and tendon at neck level, initial encounter: Secondary | ICD-10-CM | POA: Insufficient documentation

## 2023-07-28 DIAGNOSIS — S01102A Unspecified open wound of left eyelid and periocular area, initial encounter: Secondary | ICD-10-CM | POA: Diagnosis not present

## 2023-07-28 DIAGNOSIS — R4182 Altered mental status, unspecified: Secondary | ICD-10-CM | POA: Diagnosis not present

## 2023-07-28 DIAGNOSIS — S0990XA Unspecified injury of head, initial encounter: Secondary | ICD-10-CM | POA: Diagnosis not present

## 2023-07-28 DIAGNOSIS — Z23 Encounter for immunization: Secondary | ICD-10-CM | POA: Insufficient documentation

## 2023-07-28 DIAGNOSIS — S0181XA Laceration without foreign body of other part of head, initial encounter: Secondary | ICD-10-CM | POA: Diagnosis not present

## 2023-07-28 DIAGNOSIS — Y92410 Unspecified street and highway as the place of occurrence of the external cause: Secondary | ICD-10-CM | POA: Insufficient documentation

## 2023-07-28 DIAGNOSIS — S0101XA Laceration without foreign body of scalp, initial encounter: Secondary | ICD-10-CM | POA: Insufficient documentation

## 2023-07-28 DIAGNOSIS — S0182XA Laceration with foreign body of other part of head, initial encounter: Secondary | ICD-10-CM | POA: Diagnosis not present

## 2023-07-28 DIAGNOSIS — S298XXA Other specified injuries of thorax, initial encounter: Secondary | ICD-10-CM

## 2023-07-28 DIAGNOSIS — S199XXA Unspecified injury of neck, initial encounter: Secondary | ICD-10-CM | POA: Diagnosis not present

## 2023-07-28 DIAGNOSIS — Z041 Encounter for examination and observation following transport accident: Secondary | ICD-10-CM | POA: Diagnosis not present

## 2023-07-28 DIAGNOSIS — S060XAA Concussion with loss of consciousness status unknown, initial encounter: Secondary | ICD-10-CM | POA: Diagnosis not present

## 2023-07-28 DIAGNOSIS — S3993XA Unspecified injury of pelvis, initial encounter: Secondary | ICD-10-CM | POA: Diagnosis not present

## 2023-07-28 DIAGNOSIS — S060X0A Concussion without loss of consciousness, initial encounter: Secondary | ICD-10-CM | POA: Diagnosis not present

## 2023-07-28 DIAGNOSIS — S0191XA Laceration without foreign body of unspecified part of head, initial encounter: Secondary | ICD-10-CM | POA: Diagnosis not present

## 2023-07-28 DIAGNOSIS — S72302A Unspecified fracture of shaft of left femur, initial encounter for closed fracture: Secondary | ICD-10-CM | POA: Diagnosis not present

## 2023-07-28 DIAGNOSIS — R58 Hemorrhage, not elsewhere classified: Secondary | ICD-10-CM | POA: Diagnosis not present

## 2023-07-28 LAB — PROTIME-INR
INR: 1 (ref 0.8–1.2)
Prothrombin Time: 13.1 s (ref 11.4–15.2)

## 2023-07-28 LAB — I-STAT CHEM 8, ED
BUN: 10 mg/dL (ref 6–20)
BUN: 9 mg/dL (ref 6–20)
Calcium, Ion: 1.1 mmol/L — ABNORMAL LOW (ref 1.15–1.40)
Calcium, Ion: 1.1 mmol/L — ABNORMAL LOW (ref 1.15–1.40)
Chloride: 103 mmol/L (ref 98–111)
Chloride: 105 mmol/L (ref 98–111)
Creatinine, Ser: 0.8 mg/dL (ref 0.44–1.00)
Creatinine, Ser: 0.8 mg/dL (ref 0.44–1.00)
Glucose, Bld: 122 mg/dL — ABNORMAL HIGH (ref 70–99)
Glucose, Bld: 122 mg/dL — ABNORMAL HIGH (ref 70–99)
HCT: 37 % (ref 36.0–46.0)
HCT: 38 % (ref 36.0–46.0)
Hemoglobin: 12.6 g/dL (ref 12.0–15.0)
Hemoglobin: 12.9 g/dL (ref 12.0–15.0)
Potassium: 3.2 mmol/L — ABNORMAL LOW (ref 3.5–5.1)
Potassium: 3.2 mmol/L — ABNORMAL LOW (ref 3.5–5.1)
Sodium: 139 mmol/L (ref 135–145)
Sodium: 141 mmol/L (ref 135–145)
TCO2: 21 mmol/L — ABNORMAL LOW (ref 22–32)
TCO2: 22 mmol/L (ref 22–32)

## 2023-07-28 LAB — COMPREHENSIVE METABOLIC PANEL
ALT: 17 U/L (ref 0–44)
AST: 19 U/L (ref 15–41)
Albumin: 3.6 g/dL (ref 3.5–5.0)
Alkaline Phosphatase: 61 U/L (ref 38–126)
Anion gap: 7 (ref 5–15)
BUN: 9 mg/dL (ref 6–20)
CO2: 22 mmol/L (ref 22–32)
Calcium: 8.7 mg/dL — ABNORMAL LOW (ref 8.9–10.3)
Chloride: 107 mmol/L (ref 98–111)
Creatinine, Ser: 1.03 mg/dL — ABNORMAL HIGH (ref 0.44–1.00)
GFR, Estimated: >60 mL/min (ref >60)
Glucose, Bld: 124 mg/dL — ABNORMAL HIGH (ref 70–99)
Potassium: 3.1 mmol/L — ABNORMAL LOW (ref 3.5–5.1)
Sodium: 136 mmol/L (ref 135–145)
Total Bilirubin: 0.5 mg/dL (ref <1.2)
Total Protein: 6.5 g/dL (ref 6.5–8.1)

## 2023-07-28 LAB — RAPID URINE DRUG SCREEN, HOSP PERFORMED
Amphetamines: NOT DETECTED
Barbiturates: NOT DETECTED
Benzodiazepines: NOT DETECTED
Cocaine: NOT DETECTED
Opiates: NOT DETECTED
Tetrahydrocannabinol: POSITIVE — AB

## 2023-07-28 LAB — CBC
HCT: 36.4 % (ref 36.0–46.0)
Hemoglobin: 12.4 g/dL (ref 12.0–15.0)
MCH: 33.3 pg (ref 26.0–34.0)
MCHC: 34.1 g/dL (ref 30.0–36.0)
MCV: 97.8 fL (ref 80.0–100.0)
Platelets: 207 10*3/uL (ref 150–400)
RBC: 3.72 MIL/uL — ABNORMAL LOW (ref 3.87–5.11)
RDW: 12 % (ref 11.5–15.5)
WBC: 5.1 10*3/uL (ref 4.0–10.5)
nRBC: 0 % (ref 0.0–0.2)

## 2023-07-28 LAB — ETHANOL: Alcohol, Ethyl (B): <10 mg/dL (ref <10)

## 2023-07-28 LAB — SAMPLE TO BLOOD BANK

## 2023-07-28 LAB — I-STAT CG4 LACTIC ACID, ED: Lactic Acid, Venous: 2.2 mmol/L (ref 0.5–1.9)

## 2023-07-28 LAB — PREGNANCY, URINE: Preg Test, Ur: NEGATIVE

## 2023-07-28 MED ORDER — MORPHINE SULFATE (PF) 4 MG/ML IV SOLN
4.0000 mg | Freq: Once | INTRAVENOUS | Status: AC
  Administered 2023-07-28: 4 mg via INTRAVENOUS
  Filled 2023-07-28: qty 1

## 2023-07-28 MED ORDER — MORPHINE SULFATE 15 MG PO TABS: 7.5000 mg | ORAL_TABLET | ORAL | 0 refills | Status: DC | PRN

## 2023-07-28 MED ORDER — ONDANSETRON HCL 4 MG/2ML IJ SOLN
4.0000 mg | Freq: Once | INTRAMUSCULAR | Status: AC
  Administered 2023-07-28: 4 mg via INTRAVENOUS
  Filled 2023-07-28: qty 2

## 2023-07-28 MED ORDER — TETANUS-DIPHTH-ACELL PERTUSSIS 5-2.5-18.5 LF-MCG/0.5 IM SUSY
0.5000 mL | PREFILLED_SYRINGE | Freq: Once | INTRAMUSCULAR | Status: AC
  Administered 2023-07-28: 0.5 mL via INTRAMUSCULAR
  Filled 2023-07-28: qty 0.5

## 2023-07-28 MED ORDER — TETANUS-DIPHTH-ACELL PERTUSSIS 5-2.5-18.5 LF-MCG/0.5 IM SUSY: 0.5000 mL | PREFILLED_SYRINGE | Freq: Once | INTRAMUSCULAR | Status: DC

## 2023-07-28 MED ORDER — ONDANSETRON 4 MG PO TBDP: ORAL_TABLET | ORAL | 0 refills | Status: DC

## 2023-07-28 MED ORDER — FENTANYL CITRATE PF 50 MCG/ML IJ SOSY
50.0000 ug | PREFILLED_SYRINGE | Freq: Once | INTRAMUSCULAR | Status: AC
  Administered 2023-07-28: 50 ug via INTRAVENOUS
  Filled 2023-07-28: qty 1

## 2023-07-28 MED ORDER — CEPHALEXIN 500 MG PO CAPS: ORAL_CAPSULE | ORAL | 0 refills | Status: DC

## 2023-07-28 MED ORDER — IOHEXOL 350 MG/ML SOLN
75.0000 mL | Freq: Once | INTRAVENOUS | Status: AC | PRN
  Administered 2023-07-28: 75 mL via INTRAVENOUS
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
## 2023-07-28 NOTE — ED Notes (Signed)
Patient transported to CT 

## 2023-07-28 NOTE — Discharge Instructions (Signed)
Come back on Wednesday for operative repair.   Take 4 over the counter ibuprofen tablets 3 times a day or 2 over-the-counter naproxen tablets twice a day for pain. Also take tylenol 1000mg (2 extra strength) four times a day.   Then take the pain medicine if you feel like you need it. Narcotics do not help with the pain, they only make you care about it less.  You can become addicted to this, people may break into your house to steal it.  It will constipate you.  If you drive under the influence of this medicine you can get a DUI.

## 2023-07-28 NOTE — ED Notes (Signed)
IStat chem 8 ran x2. Edit sheet sent to main lab to cancle 2nd istat chem 8 test.  KM
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
## 2023-07-28 NOTE — ED Notes (Signed)
Pt forehead wound irrigated with saline and dressing changed by this RN and Programmer, multimedia. Pt tolerated well and pain medication given prior to dressing change.

## 2023-07-28 NOTE — ED Notes (Addendum)
Pts wound to forehead was irrigated & covered with non-adhesive dressing by this RN & Topher, RN. She tol this well, pain meds were given before starting. After as much dried blood was removed as tolerated this RN was able to see her additional lacs/scratches. There were 2 very small scratches above her Rt eye brow & a small lac in the Lt corner of the Rt brow. There was a small cut approx. 0.5 cm on her Rt cheek, & a small lac above her upper lip & a cut pt reports feeling in her upper lip as well. There is a small scratch above her Lt eye brow & swelling noticed developing around her Lt eye.
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
## 2023-07-28 NOTE — ED Notes (Signed)
Patient returned from CT to Trauma C at this time.
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
## 2023-07-28 NOTE — ED Notes (Signed)
ENT at bedside

## 2023-07-29 ENCOUNTER — Encounter (HOSPITAL_BASED_OUTPATIENT_CLINIC_OR_DEPARTMENT_OTHER): Payer: Self-pay | Admitting: Otolaryngology

## 2023-07-29 ENCOUNTER — Other Ambulatory Visit: Payer: Self-pay

## 2023-07-29 DIAGNOSIS — S0101XA Laceration without foreign body of scalp, initial encounter: Secondary | ICD-10-CM | POA: Diagnosis not present

## 2023-07-29 DIAGNOSIS — Y92410 Unspecified street and highway as the place of occurrence of the external cause: Secondary | ICD-10-CM | POA: Diagnosis not present

## 2023-07-29 DIAGNOSIS — Z041 Encounter for examination and observation following transport accident: Secondary | ICD-10-CM | POA: Diagnosis not present

## 2023-07-29 DIAGNOSIS — S0101XD Laceration without foreign body of scalp, subsequent encounter: Secondary | ICD-10-CM | POA: Diagnosis not present

## 2023-07-29 DIAGNOSIS — M542 Cervicalgia: Secondary | ICD-10-CM | POA: Diagnosis not present
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
## 2023-07-30 ENCOUNTER — Other Ambulatory Visit: Payer: Self-pay

## 2023-07-30 ENCOUNTER — Ambulatory Visit (HOSPITAL_BASED_OUTPATIENT_CLINIC_OR_DEPARTMENT_OTHER)
Admission: RE | Admit: 2023-07-30 | Discharge: 2023-07-30 | Disposition: A | Discharge status: Home / Self Care | Payer: 59 | Attending: Otolaryngology | Admitting: Otolaryngology

## 2023-07-30 ENCOUNTER — Ambulatory Visit (HOSPITAL_BASED_OUTPATIENT_CLINIC_OR_DEPARTMENT_OTHER): Payer: 59 | Admitting: Anesthesiology

## 2023-07-30 ENCOUNTER — Encounter (HOSPITAL_BASED_OUTPATIENT_CLINIC_OR_DEPARTMENT_OTHER)
Admission: RE | Disposition: A | Discharge status: Home / Self Care | Payer: Self-pay | Source: Home / Self Care | Attending: Otolaryngology

## 2023-07-30 ENCOUNTER — Encounter (HOSPITAL_BASED_OUTPATIENT_CLINIC_OR_DEPARTMENT_OTHER): Payer: Self-pay | Admitting: Otolaryngology

## 2023-07-30 DIAGNOSIS — S0181XA Laceration without foreign body of other part of head, initial encounter: Secondary | ICD-10-CM | POA: Insufficient documentation

## 2023-07-30 DIAGNOSIS — S0181XD Laceration without foreign body of other part of head, subsequent encounter: Secondary | ICD-10-CM

## 2023-07-30 DIAGNOSIS — Z87891 Personal history of nicotine dependence: Secondary | ICD-10-CM | POA: Insufficient documentation

## 2023-07-30 DIAGNOSIS — F1290 Cannabis use, unspecified, uncomplicated: Secondary | ICD-10-CM | POA: Insufficient documentation

## 2023-07-30 HISTORY — DX: Depression, unspecified: F32.A

## 2023-07-30 HISTORY — DX: Anemia, unspecified: D64.9

## 2023-07-30 HISTORY — PX: EXCISION MASS HEAD: SHX6702

## 2023-07-30 SURGERY — EXCISION, MASS, HEAD
Anesthesia: General | Site: Head | Laterality: Right

## 2023-07-30 MED ORDER — OXYCODONE HCL 5 MG PO TABS
5.0000 mg | ORAL_TABLET | Freq: Once | ORAL | Status: AC | PRN
  Administered 2023-07-30: 5 mg via ORAL

## 2023-07-30 MED ORDER — FENTANYL CITRATE (PF) 100 MCG/2ML IJ SOLN
INTRAMUSCULAR | Status: AC
  Filled 2023-07-30: qty 2

## 2023-07-30 MED ORDER — LACTATED RINGERS IV SOLN: INTRAVENOUS | Status: DC

## 2023-07-30 MED ORDER — ONDANSETRON HCL 4 MG/2ML IJ SOLN
INTRAMUSCULAR | Status: AC
Start: 2023-07-30 — End: ?
  Filled 2023-07-30: qty 2

## 2023-07-30 MED ORDER — BACITRACIN ZINC 500 UNIT/GM EX OINT
TOPICAL_OINTMENT | CUTANEOUS | Status: DC | PRN
  Administered 2023-07-30: 1 via TOPICAL

## 2023-07-30 MED ORDER — DEXAMETHASONE SODIUM PHOSPHATE 4 MG/ML IJ SOLN
INTRAMUSCULAR | Status: DC | PRN
  Administered 2023-07-30: 10 mg via INTRAVENOUS

## 2023-07-30 MED ORDER — MEPERIDINE HCL 25 MG/ML IJ SOLN: 6.2500 mg | INTRAMUSCULAR | Status: DC | PRN

## 2023-07-30 MED ORDER — LIDOCAINE-EPINEPHRINE 1 %-1:100000 IJ SOLN
INTRAMUSCULAR | Status: DC | PRN
  Administered 2023-07-30: 10 mL

## 2023-07-30 MED ORDER — BACITRACIN ZINC 500 UNIT/GM EX OINT
TOPICAL_OINTMENT | CUTANEOUS | Status: AC
  Filled 2023-07-30: qty 28.35

## 2023-07-30 MED ORDER — IBUPROFEN 200 MG PO TABS: 400.0000 mg | ORAL_TABLET | Freq: Four times a day (QID) | ORAL | 0 refills | Status: AC | PRN

## 2023-07-30 MED ORDER — DROPERIDOL 2.5 MG/ML IJ SOLN: 0.6250 mg | Freq: Once | INTRAMUSCULAR | Status: DC | PRN

## 2023-07-30 MED ORDER — CEFAZOLIN SODIUM-DEXTROSE 2-4 GM/100ML-% IV SOLN
2.0000 g | INTRAVENOUS | Status: AC
  Administered 2023-07-30: 2 g via INTRAVENOUS

## 2023-07-30 MED ORDER — LIDOCAINE 2% (20 MG/ML) 5 ML SYRINGE
INTRAMUSCULAR | Status: AC
Start: 2023-07-30 — End: ?
  Filled 2023-07-30: qty 5

## 2023-07-30 MED ORDER — SUGAMMADEX SODIUM 200 MG/2ML IV SOLN
INTRAVENOUS | Status: DC | PRN
  Administered 2023-07-30: 200 mg via INTRAVENOUS

## 2023-07-30 MED ORDER — SODIUM CHLORIDE 0.9 % IV SOLN
INTRAVENOUS | Status: DC | PRN
Start: 2023-07-30 — End: 2023-07-30

## 2023-07-30 MED ORDER — OXYCODONE HCL 5 MG/5ML PO SOLN: 5.0000 mg | Freq: Once | ORAL | Status: AC | PRN

## 2023-07-30 MED ORDER — MIDAZOLAM HCL 5 MG/5ML IJ SOLN
INTRAMUSCULAR | Status: DC | PRN
  Administered 2023-07-30: 2 mg via INTRAVENOUS

## 2023-07-30 MED ORDER — DEXAMETHASONE SODIUM PHOSPHATE 10 MG/ML IJ SOLN
INTRAMUSCULAR | Status: AC
  Filled 2023-07-30: qty 1

## 2023-07-30 MED ORDER — ONDANSETRON HCL 4 MG/2ML IJ SOLN
INTRAMUSCULAR | Status: DC | PRN
  Administered 2023-07-30: 4 mg via INTRAVENOUS

## 2023-07-30 MED ORDER — EPHEDRINE 5 MG/ML INJ
INTRAVENOUS | Status: AC
  Filled 2023-07-30: qty 5

## 2023-07-30 MED ORDER — LIDOCAINE HCL (CARDIAC) PF 100 MG/5ML IV SOSY
PREFILLED_SYRINGE | INTRAVENOUS | Status: DC | PRN
  Administered 2023-07-30: 100 mg via INTRAVENOUS

## 2023-07-30 MED ORDER — PROPOFOL 10 MG/ML IV BOLUS
INTRAVENOUS | Status: AC
Start: 2023-07-30 — End: ?
  Filled 2023-07-30: qty 20

## 2023-07-30 MED ORDER — ROCURONIUM BROMIDE 100 MG/10ML IV SOLN
INTRAVENOUS | Status: DC | PRN
  Administered 2023-07-30: 50 mg via INTRAVENOUS

## 2023-07-30 MED ORDER — PROPOFOL 10 MG/ML IV BOLUS
INTRAVENOUS | Status: DC | PRN
  Administered 2023-07-30: 200 mg via INTRAVENOUS

## 2023-07-30 MED ORDER — CEFAZOLIN SODIUM-DEXTROSE 2-4 GM/100ML-% IV SOLN
INTRAVENOUS | Status: AC
  Filled 2023-07-30: qty 100

## 2023-07-30 MED ORDER — EPHEDRINE SULFATE (PRESSORS) 50 MG/ML IJ SOLN
INTRAMUSCULAR | Status: DC | PRN
  Administered 2023-07-30 (×2): 5 mg via INTRAVENOUS

## 2023-07-30 MED ORDER — FENTANYL CITRATE (PF) 100 MCG/2ML IJ SOLN
25.0000 ug | INTRAMUSCULAR | Status: DC | PRN
  Administered 2023-07-30 (×3): 50 ug via INTRAVENOUS

## 2023-07-30 MED ORDER — MIDAZOLAM HCL 2 MG/2ML IJ SOLN
INTRAMUSCULAR | Status: AC
  Filled 2023-07-30: qty 2

## 2023-07-30 MED ORDER — FENTANYL CITRATE (PF) 100 MCG/2ML IJ SOLN
INTRAMUSCULAR | Status: DC | PRN
  Administered 2023-07-30 (×2): 50 ug via INTRAVENOUS

## 2023-07-30 MED ORDER — OXYCODONE HCL 5 MG PO TABS
ORAL_TABLET | ORAL | Status: AC
  Filled 2023-07-30: qty 1

## 2023-07-30 MED ORDER — OXYCODONE HCL 5 MG PO TABS: 5.0000 mg | ORAL_TABLET | Freq: Four times a day (QID) | ORAL | 0 refills | Status: AC | PRN

## 2023-07-30 SURGICAL SUPPLY — 74 items
APPLICATOR DR MATTHEWS STRL (MISCELLANEOUS) ×2 IMPLANT
BENZOIN TINCTURE PRP APPL 2/3 (GAUZE/BANDAGES/DRESSINGS) IMPLANT
BLADE CLIPPER SURG (BLADE) IMPLANT
BLADE SURG 15 STRL LF DISP TIS (BLADE) ×4 IMPLANT
BNDG ELASTIC 3INX 5YD STR LF (GAUZE/BANDAGES/DRESSINGS) IMPLANT
CANISTER SUCT 1200ML W/VALVE (MISCELLANEOUS) IMPLANT
CORD BIPOLAR FORCEPS 12FT (ELECTRODE) ×1 IMPLANT
COTTONBALL LRG STERILE PKG (GAUZE/BANDAGES/DRESSINGS) ×2 IMPLANT
COVER BACK TABLE 60X90IN (DRAPES) ×2 IMPLANT
COVER MAYO STAND STRL (DRAPES) ×2 IMPLANT
DERMABOND ADVANCED .7 DNX12 (GAUZE/BANDAGES/DRESSINGS) ×1 IMPLANT
DRAPE HEAD BAR (DRAPES) IMPLANT
DRAPE INCISE 23X17 STRL (DRAPES) IMPLANT
DRAPE INCISE IOBAN 23X17 STRL (DRAPES) IMPLANT
DRAPE U-SHAPE 76X120 STRL (DRAPES) ×2 IMPLANT
DRSG GLASSCOCK MASTOID ADT (GAUZE/BANDAGES/DRESSINGS) IMPLANT
DRSG GLASSCOCK MASTOID PED (GAUZE/BANDAGES/DRESSINGS) IMPLANT
DRSG TEGADERM 2-3/8X2-3/4 SM (GAUZE/BANDAGES/DRESSINGS) ×4 IMPLANT
DRSG TELFA 3X8 NADH STRL (GAUZE/BANDAGES/DRESSINGS) IMPLANT
ELECT COATED BLADE 2.86 ST (ELECTRODE) IMPLANT
ELECT NDL BLADE 2-5/6 (NEEDLE) ×1 IMPLANT
ELECT NEEDLE BLADE 2-5/6 (NEEDLE) ×2 IMPLANT
ELECT REM PT RETURN 9FT ADLT (ELECTROSURGICAL) ×2
ELECTRODE REM PT RTRN 9FT ADLT (ELECTROSURGICAL) ×2 IMPLANT
FORCEPS BIPOLAR SPETZLER 8 1.0 (NEUROSURGERY SUPPLIES) IMPLANT
GAUZE 4X4 16PLY ~~LOC~~+RFID DBL (SPONGE) IMPLANT
GAUZE PAD ABD 8X10 STRL (GAUZE/BANDAGES/DRESSINGS) IMPLANT
GAUZE SPONGE 2X2 STRL 8-PLY (GAUZE/BANDAGES/DRESSINGS) IMPLANT
GAUZE SPONGE 4X4 12PLY STRL (GAUZE/BANDAGES/DRESSINGS) IMPLANT
GAUZE XEROFORM 1X8 LF (GAUZE/BANDAGES/DRESSINGS) IMPLANT
GLOVE BIO SURGEON STRL SZ7.5 (GLOVE) ×2 IMPLANT
GLOVE BIOGEL PI IND STRL 8 (GLOVE) ×2 IMPLANT
GOWN STRL REUS W/ TWL LRG LVL3 (GOWN DISPOSABLE) ×2 IMPLANT
GOWN STRL REUS W/ TWL XL LVL3 (GOWN DISPOSABLE) ×2 IMPLANT
MARKER SKIN DUAL TIP RULER LAB (MISCELLANEOUS) IMPLANT
NDL HYPO 22X1.5 SAFETY MO (MISCELLANEOUS) IMPLANT
NDL HYPO 25X1 1.5 SAFETY (NEEDLE) IMPLANT
NDL HYPO 30GX1 BEV (NEEDLE) ×1 IMPLANT
NDL PRECISIONGLIDE 27X1.5 (NEEDLE) ×1 IMPLANT
NEEDLE HYPO 22X1.5 SAFETY MO (MISCELLANEOUS) IMPLANT
NEEDLE HYPO 25X1 1.5 SAFETY (NEEDLE) IMPLANT
NEEDLE HYPO 30GX1 BEV (NEEDLE) ×2 IMPLANT
NEEDLE PRECISIONGLIDE 27X1.5 (NEEDLE) ×2 IMPLANT
NS IRRIG 1000ML POUR BTL (IV SOLUTION) ×2 IMPLANT
PACK BASIN DAY SURGERY FS (CUSTOM PROCEDURE TRAY) ×2 IMPLANT
PAD ALCOHOL SWAB (MISCELLANEOUS) IMPLANT
PENCIL SMOKE EVACUATOR (MISCELLANEOUS) ×2 IMPLANT
SHEET MEDIUM DRAPE 40X70 STRL (DRAPES) ×2 IMPLANT
SLEEVE SCD COMPRESS KNEE MED (STOCKING) ×2 IMPLANT
SPONGE T-LAP 18X18 ~~LOC~~+RFID (SPONGE) ×2 IMPLANT
STAPLER SKIN PROX WIDE 3.9 (STAPLE) IMPLANT
STRIP CLOSURE SKIN 1/2X4 (GAUZE/BANDAGES/DRESSINGS) IMPLANT
STRIP CLOSURE SKIN 1/4X4 (GAUZE/BANDAGES/DRESSINGS) IMPLANT
SUCTION TUBE FRAZIER 10FR DISP (SUCTIONS) ×2 IMPLANT
SUT CHROMIC 4 0 P 3 18 (SUTURE) IMPLANT
SUT CHROMIC 5 0 P 3 (SUTURE) ×2 IMPLANT
SUT ETHILON 4 0 CL P 3 (SUTURE) IMPLANT
SUT ETHILON 5 0 PS 2 18 (SUTURE) ×2 IMPLANT
SUT MON AB 4-0 PC3 18 (SUTURE) ×1 IMPLANT
SUT MON AB 5-0 P3 18 (SUTURE) IMPLANT
SUT NYLON ETHILON 5-0 P-3 1X18 (SUTURE) IMPLANT
SUT SILK 4 0 PS 2 (SUTURE) ×1 IMPLANT
SUT VIC AB 4-0 PS2 18 (SUTURE) ×2 IMPLANT
SUT VIC AB 4-0 PS2 27 (SUTURE) IMPLANT
SUT VICRYL RAPIDE 4-0 (SUTURE) IMPLANT
SYR 3ML 23GX1 SAFETY (SYRINGE) IMPLANT
SYR CONTROL 10ML LL (SYRINGE) ×2 IMPLANT
SYR TB 1ML LL NO SAFETY (SYRINGE) IMPLANT
TAPE UMBILICAL 1/8 X36 TWILL (MISCELLANEOUS) IMPLANT
TOWEL GREEN STERILE FF (TOWEL DISPOSABLE) ×4 IMPLANT
TRAY DSU PREP LF (CUSTOM PROCEDURE TRAY) ×2 IMPLANT
TUBE CONNECTING 20X1/4 (TUBING) ×2 IMPLANT
UNDERPAD 30X36 HEAVY ABSORB (UNDERPADS AND DIAPERS) ×2 IMPLANT
YANKAUER SUCT BULB TIP NO VENT (SUCTIONS) ×1 IMPLANT
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
## 2023-07-30 NOTE — Transfer of Care (Signed)
Immediate Anesthesia Transfer of Care Note  Patient: Peggy Hooper  Procedure(s) Performed: REPAIR OF FOREHEAD LACERATION (Right: Head)  Patient Location: PACU  Anesthesia Type:General  Level of Consciousness: drowsy  Airway & Oxygen Therapy: Patient Spontanous Breathing and Patient connected to face mask oxygen  Post-op Assessment: Report given to RN and Post -op Vital signs reviewed and stable  Post vital signs: Reviewed and stable  Last Vitals:  Vitals Value Taken Time  BP    Temp    Pulse 77 07/30/23 1325  Resp 15 07/30/23 1325  SpO2 100 % 07/30/23 1325  Vitals shown include unfiled device data.  Last Pain:  Vitals:   07/30/23 1032  TempSrc: Oral  PainSc: 3       Patients Stated Pain Goal: 3 (07/30/23 1032)  Complications: No notable events documented.
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
## 2023-07-30 NOTE — Anesthesia Preprocedure Evaluation (Addendum)
Anesthesia Evaluation  Patient identified by MRN, date of birth, ID band Patient awake    Reviewed: Allergy & Precautions, H&P , NPO status , Patient's Chart, lab work & pertinent test results  Airway Mallampati: II  TM Distance: >3 FB Neck ROM: Limited    Dental no notable dental hx. (+) Dental Advisory Given, Teeth Intact   Pulmonary neg recent URI, former smoker   Pulmonary exam normal breath sounds clear to auscultation       Cardiovascular negative cardio ROS Normal cardiovascular exam Rhythm:Regular Rate:Normal     Neuro/Psych  PSYCHIATRIC DISORDERS  Depression    negative neurological ROS     GI/Hepatic negative GI ROS, Neg liver ROS,,,  Endo/Other  negative endocrine ROS    Renal/GU negative Renal ROS     Musculoskeletal   Abdominal  (+) + obese  Peds  Hematology  (+) Blood dyscrasia, anemia   Anesthesia Other Findings   Reproductive/Obstetrics negative OB ROS                             Anesthesia Physical Anesthesia Plan  ASA: 2  Anesthesia Plan: General   Post-op Pain Management: Tylenol PO (pre-op)*   Induction: Intravenous  PONV Risk Score and Plan: 3 and Ondansetron, Dexamethasone, Treatment may vary due to age or medical condition and Midazolam  Airway Management Planned: Oral ETT and Video Laryngoscope Planned  Additional Equipment:   Intra-op Plan:   Post-operative Plan: Extubation in OR  Informed Consent: I have reviewed the patients History and Physical, chart, labs and discussed the procedure including the risks, benefits and alternatives for the proposed anesthesia with the patient or authorized representative who has indicated his/her understanding and acceptance.     Dental advisory given  Plan Discussed with: CRNA  Anesthesia Plan Comments: (Risks of anesthesia explained at length. This includes, but is not limited to, sore throat, damage to  teeth, lips gums, tongue and vocal cords, nausea and vomiting, reactions to medications, stroke, heart attack, and death. All patient questions were answered and the patient wishes to proceed.  )        Anesthesia Quick Evaluation

## 2023-07-30 NOTE — Discharge Instructions (Addendum)
 Post-operative Patient Instructions Lovett Sox. Hoshal MD  Surgery What to expect: A bandage will be placed on your surgical sites. You can leave the bandages in place until you return to the clinic. You may be scheduled for a series of wound care appointments over the next month.  Recovery/Restrictions: -No strenuous activity for at least the first week after your procedure -Bruising and swelling are expected and will take weeks to go away (consider using ice packs) -Please contact our office immediately if you experience any signs/symptoms of infection (redness, pain, or fever of 100.4F or greater)  Wound Wound care: The goal is to keep your wounds clean and moist to prevent scabs or crusts. You will keep your current post-operative dressing in place undisturbed until after your first post-operative clinic visit. The following instructions apply after your first post-operative visit.  1. Clean wound wounds with soap and water using a cue tip if any crusts are present  2. Next, apply a thin layer of Aquaphor ointment 3. Apply Telfa and cover with brown tape  4. If you had an ear surgery - please apply a thin layer of antibiotic ointment or Aquaphor ointment to your ear incision every day  Care Healing Period: For the best healing, please protect the area from the sun   You may be asked to begin massaging the scars several weeks after surgery. Scars can be massaged in horizontal, vertical, and circular motions.   Adult Post-Operative Pain Management  Pain medication is given immediately following your surgery to help with post-operative pain. Do not wake up or set an alarm to wake up and take pain medications. Sleep and rest.  Upon your discharge home, we suggest scheduled doses of Acetaminophen (Tylenol) every 6 hours and Ibuprofen (Motrin) every 6 hours, alternating between medications every 3 hours (i.e. Take Tylenol and wait 3 hours, then take Motrin and wait 3 hours, repeat) for the  first 3-4 days after surgery. If you are without significant pain, medications can be taken more infrequently. It is important to follow dosing instructions on the medication bottle or prescription.   Sample of medication dosing schedule  Give dose of: Time: Given:  Acetaminophen 12 a.m.   Ibuprofen 3 a.m.   Acetaminophen 6 a.m.   Ibuprofen 9 a.m.   Acetaminophen 12 p.m.   Ibuprofen 3 p.m.   Acetaminophen 6 p.m.   Ibuprofen 9 p.m.    If you need to call after clinic hours for a concern, call [phone] and ask for the "physician on call for ENT."  [address] [place], [place] [address] Phone: [phone]    Post Anesthesia Home Care Instructions  Activity: Get plenty of rest for the remainder of the day. A responsible individual must stay with you for 24 hours following the procedure.  For the next 24 hours, DO NOT: -Drive a car -Advertising copywriter -Drink alcoholic beverages -Take any medication unless instructed by your physician -Make any legal decisions or sign important papers.  Meals: Start with liquid foods such as gelatin or soup. Progress to regular foods as tolerated. Avoid greasy, spicy, heavy foods. If nausea and/or vomiting occur, drink only clear liquids until the nausea and/or vomiting subsides. Call your physician if vomiting continues.  Special Instructions/Symptoms: Your throat may feel dry or sore from the anesthesia or the breathing tube placed in your throat during surgery. If this causes discomfort, gargle with warm salt water. The discomfort should disappear within 24 hours.  If you had a  scopolamine patch placed behind your ear for the management of post- operative nausea and/or vomiting:  1. The medication in the patch is effective for 72 hours, after which it should be removed.  Wrap patch in a tissue and discard in the trash. Wash hands thoroughly with soap and water. 2. You may remove the patch earlier than 72 hours if you experience  unpleasant side effects which may include dry mouth, dizziness or visual disturbances. 3. Avoid touching the patch. Wash your hands with soap and water after contact with the patch.
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
## 2023-07-31 ENCOUNTER — Encounter (HOSPITAL_BASED_OUTPATIENT_CLINIC_OR_DEPARTMENT_OTHER): Payer: Self-pay | Admitting: Otolaryngology

## 2023-07-31 NOTE — Anesthesia Postprocedure Evaluation (Signed)
Anesthesia Post Note  Patient: Peggy Hooper  Procedure(s) Performed: REPAIR OF FOREHEAD LACERATION (Right: Head)     Patient location during evaluation: PACU Anesthesia Type: General Level of consciousness: sedated and patient cooperative Pain management: pain level controlled Vital Signs Assessment: post-procedure vital signs reviewed and stable Respiratory status: spontaneous breathing Cardiovascular status: stable Anesthetic complications: no   No notable events documented.  Last Vitals:  Vitals:   07/30/23 1410 07/30/23 1428  BP:  129/80  Pulse: 74 86  Resp: 15 16  Temp:  (!) 36.3 C  SpO2: 94% 94%    Last Pain:  Vitals:   07/31/23 0909  TempSrc:   PainSc: 8                  Lewie Loron

## 2023-08-06 DIAGNOSIS — S0181XD Laceration without foreign body of other part of head, subsequent encounter: Secondary | ICD-10-CM | POA: Diagnosis not present

## 2023-08-06 DIAGNOSIS — S01111D Laceration without foreign body of right eyelid and periocular area, subsequent encounter: Secondary | ICD-10-CM | POA: Diagnosis not present
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
## 2023-08-13 DIAGNOSIS — S01111S Laceration without foreign body of right eyelid and periocular area, sequela: Secondary | ICD-10-CM | POA: Diagnosis not present

## 2023-08-13 DIAGNOSIS — S0181XS Laceration without foreign body of other part of head, sequela: Secondary | ICD-10-CM | POA: Diagnosis not present

## 2023-09-24 DIAGNOSIS — S01111S Laceration without foreign body of right eyelid and periocular area, sequela: Secondary | ICD-10-CM | POA: Diagnosis not present

## 2023-09-24 DIAGNOSIS — S0181XS Laceration without foreign body of other part of head, sequela: Secondary | ICD-10-CM | POA: Diagnosis not present

## 2023-09-24 DIAGNOSIS — L905 Scar conditions and fibrosis of skin: Secondary | ICD-10-CM | POA: Diagnosis not present

## 2023-09-24 NOTE — Progress Notes (Signed)
 ENT CLINIC NOTE   History of Present Illness The patient is a 33 year old female who underwent treatment for a complex forehead laceration with adjacent tissue transfer on 07/30/2023. She comes today for follow-up.  She reports persistent sutures that have not yet dissolved, with one suture causing discomfort. She has been applying silicone as part of her treatment regimen. She does not experience any numbness in the area and notes a gradual return of sensation. She also demonstrates the ability to elevate her eyebrows.  Physical Exam Ongoing healing of complex stellate forehead laceration with ongoing hypopigmentationd and hypertrophic scar.  Haiku Photo:      Results   Assessment & Plan 1. Post-procedural follow-up for complex forehead laceration. The healing process is progressing well, with no immediate need for further intervention. She was advised to continue using silicone for an additional 1 to 2 months and to avoid sun exposure on the affected area. She was also informed that she could use tweezers at home to remove any protruding sutures.  PROCEDURE The patient underwent treatment for a complex forehead laceration on 07/30/2023.  FU PRN    Electronically signed by:  Elspeth Coddington, MD  Staff Physician Facial Plastic & Reconstructive Surgery Otolaryngology - Head and Neck Surgery [facility] [place]

## 2024-05-11 ENCOUNTER — Other Ambulatory Visit: Payer: Self-pay

## 2024-05-12 ENCOUNTER — Other Ambulatory Visit: Payer: Self-pay

## 2024-05-12 MED ORDER — DESOGESTREL-ETHINYL ESTRADIOL 0.15-0.02/0.01 MG (21/5) PO TABS
1.0000 | ORAL_TABLET | Freq: Every day | ORAL | 0 refills | Status: AC
  Filled 2024-05-13: qty 84, 84d supply, fill #0

## 2024-05-13 ENCOUNTER — Other Ambulatory Visit: Payer: Self-pay

## 2024-05-13 ENCOUNTER — Other Ambulatory Visit (HOSPITAL_COMMUNITY): Payer: Self-pay

## 2024-09-01 ENCOUNTER — Emergency Department (HOSPITAL_COMMUNITY): Admission: EM | Admit: 2024-09-01 | Discharge: 2024-09-01 | Disposition: A | Discharge status: Home / Self Care

## 2024-09-01 ENCOUNTER — Emergency Department (HOSPITAL_COMMUNITY)

## 2024-09-01 ENCOUNTER — Other Ambulatory Visit: Payer: Self-pay

## 2024-09-01 ENCOUNTER — Encounter (HOSPITAL_COMMUNITY): Payer: Self-pay | Admitting: Radiology

## 2024-09-01 DIAGNOSIS — I10 Essential (primary) hypertension: Secondary | ICD-10-CM | POA: Diagnosis not present

## 2024-09-01 DIAGNOSIS — Z79899 Other long term (current) drug therapy: Secondary | ICD-10-CM | POA: Insufficient documentation

## 2024-09-01 DIAGNOSIS — R0789 Other chest pain: Secondary | ICD-10-CM | POA: Diagnosis present

## 2024-09-01 LAB — BASIC METABOLIC PANEL WITH GFR
Anion gap: 7 (ref 5–15)
BUN: 9 mg/dL (ref 6–20)
CO2: 27 mmol/L (ref 22–32)
Calcium: 9.1 mg/dL (ref 8.9–10.3)
Chloride: 104 mmol/L (ref 98–111)
Creatinine, Ser: 0.66 mg/dL (ref 0.44–1.00)
GFR, Estimated: >60 mL/min
Glucose, Bld: 96 mg/dL (ref 70–99)
Potassium: 3.4 mmol/L — ABNORMAL LOW (ref 3.5–5.1)
Sodium: 138 mmol/L (ref 135–145)

## 2024-09-01 LAB — HCG, SERUM, QUALITATIVE: Preg, Serum: NEGATIVE

## 2024-09-01 LAB — CBC WITH DIFFERENTIAL/PLATELET
Abs Immature Granulocytes: 0.01 K/uL (ref 0.00–0.07)
Basophils Absolute: 0 K/uL (ref 0.0–0.1)
Basophils Relative: 1 %
Eosinophils Absolute: 0 K/uL (ref 0.0–0.5)
Eosinophils Relative: 1 %
HCT: 35.7 % — ABNORMAL LOW (ref 36.0–46.0)
Hemoglobin: 12.2 g/dL (ref 12.0–15.0)
Immature Granulocytes: 0 %
Lymphocytes Relative: 44 %
Lymphs Abs: 1.7 K/uL (ref 0.7–4.0)
MCH: 33.7 pg (ref 26.0–34.0)
MCHC: 34.2 g/dL (ref 30.0–36.0)
MCV: 98.6 fL (ref 80.0–100.0)
Monocytes Absolute: 0.4 K/uL (ref 0.1–1.0)
Monocytes Relative: 9 %
Neutro Abs: 1.7 K/uL (ref 1.7–7.7)
Neutrophils Relative %: 45 %
Platelets: 259 K/uL (ref 150–400)
RBC: 3.62 MIL/uL — ABNORMAL LOW (ref 3.87–5.11)
RDW: 12.2 % (ref 11.5–15.5)
WBC: 3.9 K/uL — ABNORMAL LOW (ref 4.0–10.5)
nRBC: 0 % (ref 0.0–0.2)

## 2024-09-01 LAB — MAGNESIUM: Magnesium: 1.8 mg/dL (ref 1.7–2.4)

## 2024-09-01 LAB — TROPONIN T, HIGH SENSITIVITY: Troponin T High Sensitivity: <15 ng/L (ref 0–19)

## 2024-09-01 MED ORDER — AMLODIPINE BESYLATE 5 MG PO TABS
5.0000 mg | ORAL_TABLET | Freq: Once | ORAL | Status: AC
  Administered 2024-09-01: 5 mg via ORAL
  Filled 2024-09-01: qty 1

## 2024-09-01 MED ORDER — AMLODIPINE BESYLATE 5 MG PO TABS: 5.0000 mg | ORAL_TABLET | Freq: Every day | ORAL | 0 refills | Status: AC

## 2024-09-01 MED ORDER — KETOROLAC TROMETHAMINE 15 MG/ML IJ SOLN
15.0000 mg | Freq: Once | INTRAMUSCULAR | Status: AC
  Administered 2024-09-01: 15 mg via INTRAVENOUS
  Filled 2024-09-01: qty 1

## 2024-09-01 NOTE — ED Triage Notes (Signed)
 Pt reports has been checking bp x 2-3 b/c of her symptoms of HA/dizziness/SOB and thought it was HTN but denies ever being diagnosed or prescribes meds for it. Pt hypertensive in triage

## 2024-09-01 NOTE — Discharge Instructions (Addendum)
 Please take the blood pressure medications as prescribed and follow-up with your doctor.  Return to the ER for worsening symptoms.  Thank you for the opportunity to take care of you in our Emergency Department. You have been diagnosed with high blood pressure, also known as hypertension. This means that the force of blood against the walls of your blood vessels called is too strong. It also means that your heart has to work harder to move the blood. High blood pressure usually has no symptoms, but over time, it can cause serious health problems such as Heart attack and heart failure Stroke Kidney disease and failure Vision loss With the help from your healthcare provider and some important life style changes, you can manage your blood pressure and protect your health. Please read the instructions provided on hypertension, how to manage it and how to check your blood pressure. Additionally, use the blood pressure log provided to record your blood pressures. Take the blood pressure log with you to your primary care doctor so that they can adjust your blood pressure medications if needed. Please read the instructions on follow-up appointment. Return to the ER or Call 911 right away if you have any of these symptoms: Chest pain or shortness of breath Severe headache Weakness, tingling, or numbness of your face, arms, or legs (especially on 1 side of the body) Sudden change in vision Confusion, trouble speaking, or trouble understanding speech

## 2024-09-01 NOTE — ED Provider Triage Note (Signed)
 Emergency Medicine Provider Triage Evaluation Note  Peggy Hooper , a 33 y.o. female  was evaluated in triage.  Pt complains of shortness of breath, dizziness with elevated blood pressure.  Review of Systems  Positive: As above Negative: As above  Physical Exam  BP (!) 173/91 (BP Location: Left Arm)   Pulse 71   Temp 98 F (36.7 C) (Oral)   Resp 18   Ht 5' 7 (1.702 m)   Wt 90.7 kg   LMP 07/21/2024 (Approximate)   SpO2 100%   BMI 31.32 kg/m  Gen:   Awake, no distress   Resp:  Normal effort  MSK:   Moves extremities without difficulty  Other:  No focal neurological deficits.  Medical Decision Making  Medically screening exam initiated at 1:44 PM.  Appropriate orders placed.  Peggy Hooper was informed that the remainder of the evaluation will be completed by another provider, this initial triage assessment does not replace that evaluation, and the importance of remaining in the ED until their evaluation is complete.    Hildegard Loge, PA-C 09/01/24 1345

## 2024-09-01 NOTE — ED Provider Notes (Signed)
 " Peggy Hooper Provider Note   CSN: [id number] Arrival date & time: 09/01/24  1311     Patient presents with: Hypertension, Dizziness, and Headache   Peggy Hooper is a 33 y.o. female.   33 year old female with no reported past medical history presenting to the emergency department today with chest tightness, lightheadedness, and headaches.  Patient states that she has been having symptoms now intermittently over the past few days.  States that she has been checking her blood pressure at home and it has been elevated.  She states that the headaches started off gradually but have become a little more intense over the past few days.  She denies any focal weakness, numbness, or tingling.  Reports that the headaches are global in character.  She came to the emergency department today due to worsening symptoms.  She denies any room spinning dizziness.   Hypertension Associated symptoms include headaches.  Dizziness Associated symptoms: headaches   Headache Associated symptoms: dizziness        Prior to Admission medications  Medication Sig Start Date End Date Taking? Authorizing Provider  amLODipine  (NORVASC ) 5 MG tablet Take 1 tablet (5 mg total) by mouth daily. 09/01/24  Yes Ula Prentice SAUNDERS, MD  acetaminophen  (TYLENOL ) 500 MG tablet Take 1,500 mg by mouth every 6 (six) hours as needed for moderate pain.    [provider]  Ascorbic Acid (VITAMIN C PO) Take 2 tablets by mouth daily.    [provider]  cephALEXin  (KEFLEX ) 500 MG capsule 2 caps po bid x 7 days 07/28/23   Floyd, Dan, DO  desogestrel -ethinyl estradiol  (AZURETTE ) 0.15-0.02/0.01 MG (21/5) tablet Take 1 tablet by mouth daily at the same time.Preferably at supper or at bedtime. 04/22/24     hydrOXYzine  (ATARAX ) 10 MG tablet Take 1 tablet (10 mg total) by mouth 2 (two) times daily as needed for anxiety. Patient not taking: Reported on 07/13/2023 02/27/22   Dasie Ellouise CROME,  FNP  Multiple Vitamins-Minerals (MULTIVITAMIN WITH MINERALS) tablet Take 2 tablets by mouth daily.    [provider]  neomycin -polymyxin-hydrocortisone (CORTISPORIN ) 3.5-10000-1 OTIC suspension Place 4 drops into both ears 4 (four) times daily. Apply 4 times daily for 7 days Patient not taking: Reported on 07/13/2023 07/16/20   Laurice Maude BROCKS, MD  ondansetron  (ZOFRAN -ODT) 4 MG disintegrating tablet 4mg  ODT q4 hours prn nausea/vomit 07/28/23   Floyd, Dan, DO    Allergies: Patient has no known allergies.    Review of Systems  Neurological:  Positive for dizziness and headaches.  All other systems reviewed and are negative.   Updated Vital Signs BP (!) 173/91 (BP Location: Left Arm)   Pulse 71   Temp 98 F (36.7 C) (Oral)   Resp 18   Ht 5' 7 (1.702 m)   Wt 90.7 kg   LMP 08/06/2024 (Approximate)   SpO2 100%   BMI 31.32 kg/m   Physical Exam Vitals and nursing note reviewed.   Gen: NAD Eyes: PERRL, EOMI HEENT: no oropharyngeal swelling Neck: trachea midline Resp: clear to auscultation bilaterally Card: RRR, no murmurs, rubs, or gallops Abd: nontender, nondistended Extremities: no calf tenderness, no edema Vascular: 2+ radial pulses bilaterally, 2+ DP pulses bilaterally Neuro: Cranial nerves intact, equal strength sensation throughout bilateral upper and lower extremities with no dysmetria on finger-to-nose testing Skin: no rashes Psyc: acting appropriately   (all labs ordered are listed, but only abnormal results are displayed) Labs Reviewed  BASIC METABOLIC PANEL WITH GFR -  Abnormal; Notable for the following components:      Result Value   Potassium 3.4 (*)    All other components within normal limits  CBC WITH DIFFERENTIAL/PLATELET - Abnormal; Notable for the following components:   WBC 3.9 (*)    RBC 3.62 (*)    HCT 35.7 (*)    All other components within normal limits  MAGNESIUM   HCG, SERUM, QUALITATIVE  TROPONIN T, HIGH SENSITIVITY     EKG: None  Radiology: CT Head Wo Contrast Result Date: 09/01/2024 CLINICAL DATA:  Headache, dizziness and shortness of breath. EXAM: CT HEAD WITHOUT CONTRAST TECHNIQUE: Contiguous axial images were obtained from the base of the skull through the vertex without intravenous contrast. RADIATION DOSE REDUCTION: This exam was performed according to the departmental dose-optimization program which includes automated exposure control, adjustment of the mA and/or kV according to patient size and/or use of iterative reconstruction technique. COMPARISON:  July 28, 2023 FINDINGS: Brain: No evidence of acute infarction, hemorrhage, hydrocephalus, extra-axial collection or mass lesion/mass effect. Vascular: No hyperdense vessel or unexpected calcification. Skull: Normal. Negative for fracture or focal lesion. Sinuses/Orbits: A 1.6 cm posterior right maxillary sinus polyp versus mucous retention cyst is seen. Other: None. IMPRESSION: 1. No acute intracranial abnormality. 2. Right maxillary sinus polyp versus mucous retention cyst. Electronically Signed   By: Suzen Dials M.D.   On: 09/01/2024 14:53   DG Chest 2 View Result Date: 09/01/2024 CLINICAL DATA:  Dyspnea EXAM: CHEST - 2 VIEW COMPARISON:  July 13, 2023 FINDINGS: The heart size and mediastinal contours are within normal limits. Both lungs are clear. The visualized skeletal structures are unremarkable. IMPRESSION: No active cardiopulmonary disease. Electronically Signed   By: Lynwood Landy Raddle M.D.   On: 09/01/2024 14:47     Procedures   Medications Ordered in the ED  amLODipine  (NORVASC ) tablet 5 mg (5 mg Oral Given 09/01/24 1509)  ketorolac  (TORADOL ) 15 MG/ML injection 15 mg (15 mg Intravenous Given 09/01/24 1511)                                    Medical Decision Making 33 year old female with no reported past medical history presenting to the emergency department today with complaints of chest tightness, lightheadedness, and  headache in the setting of elevated blood pressure.  Based on description of the patient's symptoms suspicion for subarachnoid hemorrhage is low at this time.  CT scan of her head ordered at triage is negative.  The patient's labs are not consistent with any endorgan dysfunction.  Troponin is negative and EKG is nonischemic.  Will start the patient on amlodipine  here.  Will give her Toradol  for headache.  She reports she is feeling better on reassessment.  I think that she is stable for discharge.  Will start her on blood pressure medications after reviewing her chart it does appear that she has been hypertensive at previous ER visits as well.  Amount and/or Complexity of Data Reviewed Labs: ordered.  Risk Prescription drug management.        Final diagnoses:  Hypertension, unspecified type  Uncontrolled hypertension    ED Discharge Orders          Ordered    amLODipine  (NORVASC ) 5 MG tablet  Daily        09/01/24 1554    Referral to [facility] Care Management       Comments: Pharmacy Medication Management for Uncontrolled hypertension in  the ED   09/01/24 1556               Ula Prentice SAUNDERS, MD 09/01/24 1557  "

## 2024-09-03 ENCOUNTER — Telehealth: Payer: Self-pay

## 2024-09-03 NOTE — Progress Notes (Signed)
 Complex Care Management Note  Care Guide Note 09/03/2024 Name: Peggy Hooper MRN: [id number] DOB: [birth year]  Peggy Hooper is a 33 y.o. year old female who sees Pcp, No for primary care. I reached out to [place] by phone today to offer complex care management services.  [name] was given information about Complex Care Management services today including:   The Complex Care Management services include support from the care team which includes your Nurse Care Manager, Clinical Social Worker, or Pharmacist.  The Complex Care Management team is here to help remove barriers to the health concerns and goals most important to you. Complex Care Management services are voluntary, and the patient may decline or stop services at any time by request to their care team member.   Complex Care Management Consent Status: Patient agreed to services and verbal consent obtained.   Follow up plan:  Telephone appointment with complex care management team member scheduled for:  09/10/24 at 10:00 a.m.   Encounter Outcome:  Patient Scheduled  Dreama [facility], [facility] VBCI Assistant Direct Dial: [phone]  Fax: [phone]

## 2024-09-10 ENCOUNTER — Other Ambulatory Visit: Payer: Self-pay

## 2024-09-10 NOTE — Progress Notes (Signed)
" ° °  09/10/2024 Name: Peggy Hooper MRN: [id number] DOB: 05-27-1991  Chief Complaint  Patient presents with   Hypertension    Peggy Hooper is a 34 y.o. year old female who presented for a telephone visit.   They were referred to the pharmacist by an Emergency Department Provider Peggy Medicus, MD) for assistance in managing hypertension and medication access.    Subjective:  Care Team: Primary Care Provider: Annabella Hooper, [place] [facility]; Next Scheduled Visit: 09/22/24  Medication Access/Adherence  Current Pharmacy:  [id number] - [place], [place] - [address] AT [address] [place] [address] Phone: [phone] Fax: [phone]   Patient reports affordability concerns with their medications: No  Patient reports access/transportation concerns to their pharmacy: No  Patient reports adherence concerns with their medications:  No     Hypertension:  Current medications: Amlodipne 5mg  daily Medications previously tried: None    Patient denies hypotensive s/sx including dizziness, lightheadedness.  Patient denies hypertensive symptoms including headache, chest pain, shortness of breath  Saw a new PCP on 09/08/24, who renewed her amlodipine  rx. Reports feeling much better since leaving ER. BP was 130/78 at the new PCP visit  Has a f/u visit on 09/22/24. Requested to cancel [facility] PCP visit sch for Feb as no longer needed   Objective:  No results found for: HGBA1C  Lab Results  Component Value Date   CREATININE 0.66 09/01/2024   BUN 9 09/01/2024   NA 138 09/01/2024   K 3.4 (L) 09/01/2024   CL 104 09/01/2024   CO2 27 09/01/2024    No results found for: CHOL, HDL, LDLCALC, LDLDIRECT, TRIG, CHOLHDL  Medications Reviewed Today     Reviewed by Hooper Peggy DEL, RPH (Pharmacist) on 09/10/24 at 1010  Med List Status: <None>   Medication Order Taking? Sig Documenting Provider Last Dose Status Informant  acetaminophen   (TYLENOL ) 500 MG tablet [id number] Yes Take 1,500 mg by mouth every 6 (six) hours as needed for moderate pain. [provider]  Active Self  amLODipine  (NORVASC ) 5 MG tablet [id number] Yes Take 1 tablet (5 mg total) by mouth daily. Hooper Prentice SAUNDERS, MD  Active   Ascorbic Acid (VITAMIN C PO) [id number]  Take 2 tablets by mouth daily. [provider]  Active Self    Discontinued 09/10/24 1004 (Completed Course)   desogestrel -ethinyl estradiol  (AZURETTE ) 0.15-0.02/0.01 MG (21/5) tablet [id number]  Take 1 tablet by mouth daily at the same time.Preferably at supper or at bedtime.   Active    Patient not taking:   Discontinued 09/10/24 1005 (Completed Course) Multiple Vitamins-Minerals (MULTIVITAMIN WITH MINERALS) tablet [id number]  Take 2 tablets by mouth daily. [provider]  Active Self   Patient not taking:   Discontinued 09/10/24 1005 (Completed Course)   Discontinued 09/10/24 1005 (Completed Course)               Assessment/Plan:   Hypertension: - Currently controlled - Reviewed long term cardiovascular and renal outcomes of uncontrolled blood pressure - Reviewed appropriate blood pressure monitoring technique and reviewed goal blood pressure. Recommended to check home blood pressure and heart rate routinely - Recommend to continue current med therapy and keep all PCP appts to maintain access to meds and BP control     Follow Up Plan: maintain PCP appt on 09/22/24  Peggy Hooper, PharmD Clinical Pharmacist [phone]  "

## 2024-10-22 ENCOUNTER — Ambulatory Visit: Admitting: Nurse Practitioner
